# Patient Record
Sex: Female | Born: 1950
Health system: Southern US, Community
[De-identification: ages and names within clinical notes are randomized; demographics above are authoritative.]

## PROBLEM LIST (undated history)

## (undated) DIAGNOSIS — I10 Essential (primary) hypertension: Secondary | ICD-10-CM

## (undated) DIAGNOSIS — K635 Polyp of colon: Secondary | ICD-10-CM

## (undated) DIAGNOSIS — E785 Hyperlipidemia, unspecified: Secondary | ICD-10-CM

## (undated) DIAGNOSIS — K219 Gastro-esophageal reflux disease without esophagitis: Secondary | ICD-10-CM

## (undated) DIAGNOSIS — T8859XA Other complications of anesthesia, initial encounter: Secondary | ICD-10-CM

## (undated) DIAGNOSIS — Z8489 Family history of other specified conditions: Secondary | ICD-10-CM

## (undated) DIAGNOSIS — F419 Anxiety disorder, unspecified: Secondary | ICD-10-CM

## (undated) DIAGNOSIS — Z9889 Other specified postprocedural states: Secondary | ICD-10-CM

## (undated) DIAGNOSIS — R319 Hematuria, unspecified: Secondary | ICD-10-CM

## (undated) DIAGNOSIS — T4145XA Adverse effect of unspecified anesthetic, initial encounter: Secondary | ICD-10-CM

## (undated) DIAGNOSIS — C801 Malignant (primary) neoplasm, unspecified: Secondary | ICD-10-CM

## (undated) DIAGNOSIS — N189 Chronic kidney disease, unspecified: Secondary | ICD-10-CM

## (undated) DIAGNOSIS — R112 Nausea with vomiting, unspecified: Secondary | ICD-10-CM

## (undated) DIAGNOSIS — M810 Age-related osteoporosis without current pathological fracture: Secondary | ICD-10-CM

## (undated) HISTORY — DX: Hyperlipidemia, unspecified: E78.5

## (undated) HISTORY — DX: Age-related osteoporosis without current pathological fracture: M81.0

## (undated) HISTORY — DX: Polyp of colon: K63.5

## (undated) HISTORY — DX: Hematuria, unspecified: R31.9

## (undated) HISTORY — DX: Anxiety disorder, unspecified: F41.9

## (undated) HISTORY — PX: OTHER SURGICAL HISTORY: SHX169

## (undated) HISTORY — DX: Malignant (primary) neoplasm, unspecified: C80.1

## (undated) HISTORY — DX: Chronic kidney disease, unspecified: N18.9

---

## 2001-03-14 DIAGNOSIS — C801 Malignant (primary) neoplasm, unspecified: Secondary | ICD-10-CM

## 2001-03-14 HISTORY — DX: Malignant (primary) neoplasm, unspecified: C80.1

## 2001-03-29 ENCOUNTER — Other Ambulatory Visit: Admission: RE | Admit: 2001-03-29 | Discharge: 2001-03-29 | Payer: Self-pay | Admitting: Family Medicine

## 2001-10-01 HISTORY — PX: OTHER SURGICAL HISTORY: SHX169

## 2002-07-15 LAB — HM COLONOSCOPY: HM Colonoscopy: NORMAL

## 2004-01-29 ENCOUNTER — Ambulatory Visit: Payer: Self-pay | Admitting: Specialist

## 2004-04-30 ENCOUNTER — Ambulatory Visit: Payer: Self-pay | Admitting: Specialist

## 2004-05-12 ENCOUNTER — Ambulatory Visit: Payer: Self-pay

## 2005-03-21 ENCOUNTER — Ambulatory Visit: Payer: Self-pay | Admitting: Urology

## 2005-06-07 ENCOUNTER — Ambulatory Visit: Payer: Self-pay

## 2005-06-13 ENCOUNTER — Ambulatory Visit: Payer: Self-pay | Admitting: Urology

## 2005-12-14 ENCOUNTER — Ambulatory Visit: Payer: Self-pay | Admitting: Urology

## 2006-06-13 ENCOUNTER — Ambulatory Visit: Payer: Self-pay | Admitting: Family Medicine

## 2006-06-21 ENCOUNTER — Ambulatory Visit: Payer: Self-pay | Admitting: Urology

## 2007-08-13 ENCOUNTER — Ambulatory Visit: Payer: Self-pay | Admitting: Urology

## 2008-06-02 ENCOUNTER — Ambulatory Visit: Payer: Self-pay | Admitting: Family Medicine

## 2008-06-05 ENCOUNTER — Ambulatory Visit: Payer: Self-pay | Admitting: Family Medicine

## 2008-06-30 ENCOUNTER — Ambulatory Visit: Payer: Self-pay | Admitting: Urology

## 2008-12-08 ENCOUNTER — Ambulatory Visit: Payer: Self-pay | Admitting: Family Medicine

## 2009-02-19 ENCOUNTER — Emergency Department: Payer: Self-pay | Admitting: Emergency Medicine

## 2009-06-16 ENCOUNTER — Ambulatory Visit: Payer: Self-pay | Admitting: Family Medicine

## 2009-06-24 ENCOUNTER — Ambulatory Visit: Payer: Self-pay | Admitting: Urology

## 2010-06-10 LAB — HM PAP SMEAR: HM Pap smear: NORMAL

## 2010-06-16 ENCOUNTER — Ambulatory Visit: Payer: Self-pay | Admitting: Family Medicine

## 2010-06-30 ENCOUNTER — Ambulatory Visit: Payer: Self-pay | Admitting: Urology

## 2010-07-07 ENCOUNTER — Ambulatory Visit: Payer: Self-pay | Admitting: Family Medicine

## 2010-08-31 ENCOUNTER — Ambulatory Visit: Payer: Self-pay | Admitting: Family Medicine

## 2011-04-29 ENCOUNTER — Ambulatory Visit: Payer: Self-pay | Admitting: Unknown Physician Specialty

## 2011-12-19 ENCOUNTER — Other Ambulatory Visit: Payer: Self-pay | Admitting: Family Medicine

## 2012-03-26 ENCOUNTER — Encounter: Payer: Self-pay | Admitting: Family Medicine

## 2012-03-26 ENCOUNTER — Ambulatory Visit (INDEPENDENT_AMBULATORY_CARE_PROVIDER_SITE_OTHER): Payer: BC Managed Care – PPO | Admitting: Family Medicine

## 2012-03-26 VITALS — BP 134/84 | HR 61 | Temp 97.9°F | Resp 16 | Ht 64.0 in | Wt 150.8 lb

## 2012-03-26 DIAGNOSIS — Z1239 Encounter for other screening for malignant neoplasm of breast: Secondary | ICD-10-CM

## 2012-03-26 DIAGNOSIS — R7309 Other abnormal glucose: Secondary | ICD-10-CM

## 2012-03-26 DIAGNOSIS — E7800 Pure hypercholesterolemia, unspecified: Secondary | ICD-10-CM

## 2012-03-26 DIAGNOSIS — I10 Essential (primary) hypertension: Secondary | ICD-10-CM

## 2012-03-26 DIAGNOSIS — C649 Malignant neoplasm of unspecified kidney, except renal pelvis: Secondary | ICD-10-CM

## 2012-03-26 DIAGNOSIS — M8580 Other specified disorders of bone density and structure, unspecified site: Secondary | ICD-10-CM

## 2012-03-26 DIAGNOSIS — F329 Major depressive disorder, single episode, unspecified: Secondary | ICD-10-CM

## 2012-03-26 DIAGNOSIS — F419 Anxiety disorder, unspecified: Secondary | ICD-10-CM

## 2012-03-26 DIAGNOSIS — R05 Cough: Secondary | ICD-10-CM

## 2012-03-26 LAB — HEMOGLOBIN A1C
Hgb A1c MFr Bld: 5.9 % — ABNORMAL HIGH
Mean Plasma Glucose: 123 mg/dL — ABNORMAL HIGH

## 2012-03-26 LAB — CBC WITH DIFFERENTIAL/PLATELET
Basophils Relative: 1 % (ref 0–1)
Eosinophils Absolute: 0.1 10*3/uL (ref 0.0–0.7)
HCT: 40.1 % (ref 36.0–46.0)
Hemoglobin: 13.4 g/dL (ref 12.0–15.0)
MCH: 29.1 pg (ref 26.0–34.0)
MCHC: 33.4 g/dL (ref 30.0–36.0)
Monocytes Absolute: 0.4 10*3/uL (ref 0.1–1.0)
Monocytes Relative: 6 % (ref 3–12)

## 2012-03-26 LAB — LIPID PANEL
HDL: 64 mg/dL (ref >39)
LDL Cholesterol: 90 mg/dL (ref 0–99)
Triglycerides: 87 mg/dL (ref <150)
VLDL: 17 mg/dL (ref 0–40)

## 2012-03-26 LAB — COMPREHENSIVE METABOLIC PANEL
Alkaline Phosphatase: 40 U/L (ref 39–117)
BUN: 21 mg/dL (ref 6–23)
Glucose, Bld: 98 mg/dL (ref 70–99)
Total Bilirubin: 0.7 mg/dL (ref 0.3–1.2)

## 2012-03-26 LAB — CK: Total CK: 87 U/L (ref 7–177)

## 2012-03-26 NOTE — Progress Notes (Signed)
[address]   [place], [place]  [address]   [phone]  Subjective:    Patient ID: Margaret Massey, female    DOB: [birth year], 62 y.o.   MRN: [id number]  HPIThis 62 y.o. female presents to establish care and for nine month follow-up:  1.  Hyperlipidemia:  Nine month follow-up.  No changes to management made at last visit.  Reports compliance with medication, good tolerance to medication, good symptom control. Denies chest pain, palpitations, shortness of breath, leg swelling. Denies HA/dizziness/focal weakness/paresthesias.  2.  Depression: nine month follow-up.  Decreased Lexapro to 10mg  daily at last visit; coping well.  Family stressors recently; will be a challenging few months ahead.  Denies SI/HI. Reports good compliance with medication; good tolerance to medication; good symptom control. Work stressors stable.  Not exercising. Sleeping well.   3. Cough:  S/p [facility] consultation; +chronic PND; started on Prilosec 40mg  daily; s/p allergy testing negative.  Cough mildly better on Prilosec.  S/p CT scan sinuses.  S/p CXR negative.  Prescribed nasal spray initially but then stopped.  +nihgttime congestion.  Heat 67 degrees. Frequent throat clearing from PND.  Congested every morning.   4.  Renal Cell Carcinoma:  Appointment in 05/2012 with Dr. Achilles Dunk; followed annually at this time; doing well.    5.  Mammogram notification:  Due for mammogram; not sure when last CPE completed.  6.  Colon polyps:  Called about need for colonoscopy but did not complete stool cards provided; advised by [facility] that she was not due for colonoscopy.  Not due for colonoscopy.  [facility].     Review of Systems  Constitutional: Negative for fever, chills, diaphoresis, activity change, appetite change, fatigue and unexpected weight change.  HENT: Positive for congestion and postnasal drip. Negative for ear pain, sore throat, rhinorrhea, trouble swallowing, voice change and sinus pressure.     Respiratory: Positive for cough. Negative for shortness of breath and wheezing.   Cardiovascular: Negative for chest pain, palpitations and leg swelling.  Neurological: Negative for dizziness, tremors, syncope, facial asymmetry, speech difficulty, weakness, light-headedness, numbness and headaches.  Psychiatric/Behavioral: Negative for suicidal ideas, sleep disturbance, self-injury and dysphoric mood. The patient is nervous/anxious.         Past Medical History  Diagnosis Date  . Chronic kidney disease   . Anxiety   . Cancer     Renal Cell Carcinoma s/p nephrectomy  . Hyperlipidemia   . Osteoporosis     Past Surgical History  Procedure Date  . Right kidney removal     Prior to Admission medications   Medication Sig Start Date End Date Taking? Authorizing Provider  atorvastatin (LIPITOR) 10 MG tablet Take 10 mg by mouth daily.   Yes Historical Provider, MD  escitalopram (LEXAPRO) 10 MG tablet Take 10 mg by mouth daily.   Yes Historical Provider, MD  fish oil-omega-3 fatty acids 1000 MG capsule Take 2 g by mouth daily.   Yes Historical Provider, MD  Multiple Vitamin (MULTIVITAMIN) tablet Take 1 tablet by mouth daily.   Yes Historical Provider, MD  omeprazole (PRILOSEC) 40 MG capsule Take 40 mg by mouth daily.   Yes Historical Provider, MD  OVER THE COUNTER MEDICATION Calcium D3 750 mg taking daily   Yes Historical Provider, MD    No Known Allergies  History   Social History  . Marital Status: Single    Spouse Name: N/A    Number of Children: N/A  . Years of Education: college  Occupational History  . operation Production designer, theatre/television/film    Social History Main Topics  . Smoking status: Never Smoker   . Smokeless tobacco: Never Used  . Alcohol Use: 0.6 oz/week    1 Glasses of wine per week     Comment: 1 glass of wine per month on average.  . Drug Use: No  . Sexually Active: Yes    Birth Control/ Protection: Post-menopausal   Other Topics Concern  . Not on file   Social History  Narrative   Marital status: married   Children:   Lives: with husband   Employment:  Nature conservation officer   Tobacco: never   Alcohol: one glass of wine per month   Drugs: none  Exercise:  none    Family History  Problem Relation Age of Onset  . Heart disease Father   . Heart disease Sister     pacemaker    Objective:   Physical Exam  Nursing note and vitals reviewed. Constitutional: She is oriented to person, place, and time. She appears well-developed and well-nourished. No distress.  HENT:  Head: Normocephalic and atraumatic.  Right Ear: External ear normal.  Left Ear: External ear normal.  Nose: Nose normal.  Mouth/Throat: Oropharynx is clear and moist. No oropharyngeal exudate.  Eyes: Conjunctivae normal and EOM are normal. Pupils are equal, round, and reactive to light.  Neck: Normal range of motion. Neck supple. No thyromegaly present.  Cardiovascular: Normal rate, regular rhythm and normal heart sounds.  Exam reveals no gallop and no friction rub.   No murmur heard. Pulmonary/Chest: Effort normal and breath sounds normal. She has no wheezes. She has no rales.  Lymphadenopathy:    She has no cervical adenopathy.  Neurological: She is alert and oriented to person, place, and time. No cranial nerve deficit. She exhibits normal muscle tone. Coordination normal.  Skin: Skin is warm and dry. No rash noted. She is not diaphoretic.  Psychiatric: She has a normal mood and affect. Her behavior is normal. Judgment and thought content normal.       Assessment & Plan:   1. Essential hypertension, benign  CBC with Differential, Comprehensive metabolic panel, CK  2. Pure hypercholesterolemia  CBC with Differential, Lipid panel, CK  3. Other abnormal glucose  Hemoglobin A1c  4. Breast cancer screening  MM Digital Screening  5. Anxiety and depression

## 2012-03-26 NOTE — Patient Instructions (Addendum)
1. Essential hypertension, benign  CBC with Differential, Comprehensive metabolic panel, CK  2. Pure hypercholesterolemia  CBC with Differential, Lipid panel, CK  3. Other abnormal glucose  Hemoglobin A1c

## 2012-03-27 ENCOUNTER — Encounter: Payer: Self-pay | Admitting: Family Medicine

## 2012-03-27 DIAGNOSIS — R05 Cough: Secondary | ICD-10-CM | POA: Insufficient documentation

## 2012-03-27 DIAGNOSIS — R7309 Other abnormal glucose: Secondary | ICD-10-CM | POA: Insufficient documentation

## 2012-03-27 DIAGNOSIS — F419 Anxiety disorder, unspecified: Secondary | ICD-10-CM | POA: Insufficient documentation

## 2012-03-27 DIAGNOSIS — E7800 Pure hypercholesterolemia, unspecified: Secondary | ICD-10-CM | POA: Insufficient documentation

## 2012-03-27 DIAGNOSIS — M8580 Other specified disorders of bone density and structure, unspecified site: Secondary | ICD-10-CM | POA: Insufficient documentation

## 2012-03-27 DIAGNOSIS — C649 Malignant neoplasm of unspecified kidney, except renal pelvis: Secondary | ICD-10-CM | POA: Insufficient documentation

## 2012-03-27 DIAGNOSIS — Z1239 Encounter for other screening for malignant neoplasm of breast: Secondary | ICD-10-CM | POA: Insufficient documentation

## 2012-03-27 NOTE — Assessment & Plan Note (Signed)
New. Repeat today; recommend dietary modification, weight loss, exercise.

## 2012-03-27 NOTE — Assessment & Plan Note (Signed)
Stable; refill of Lexapro; family stressors recently; to call if warrants increase in Lexapro dose before next appointment.

## 2012-03-27 NOTE — Assessment & Plan Note (Signed)
Controlled; no change in management; obtain labs. 

## 2012-03-27 NOTE — Assessment & Plan Note (Signed)
Persistent but improved with Prilosec and temporary nasal spray use; obtain records from [facility].

## 2012-03-27 NOTE — Assessment & Plan Note (Signed)
Refer for mammogram; schedule CPE for next visit.

## 2012-04-24 ENCOUNTER — Encounter: Payer: Self-pay | Admitting: *Deleted

## 2012-05-21 ENCOUNTER — Encounter: Payer: Self-pay | Admitting: Family Medicine

## 2012-06-12 ENCOUNTER — Other Ambulatory Visit: Payer: Self-pay | Admitting: Family Medicine

## 2012-07-11 ENCOUNTER — Ambulatory Visit: Payer: Self-pay | Admitting: Urology

## 2012-07-11 DIAGNOSIS — R3129 Other microscopic hematuria: Secondary | ICD-10-CM | POA: Insufficient documentation

## 2012-07-11 DIAGNOSIS — N3020 Other chronic cystitis without hematuria: Secondary | ICD-10-CM | POA: Insufficient documentation

## 2012-07-11 DIAGNOSIS — N393 Stress incontinence (female) (male): Secondary | ICD-10-CM | POA: Insufficient documentation

## 2012-07-11 DIAGNOSIS — Z87448 Personal history of other diseases of urinary system: Secondary | ICD-10-CM | POA: Insufficient documentation

## 2012-07-11 DIAGNOSIS — R339 Retention of urine, unspecified: Secondary | ICD-10-CM | POA: Insufficient documentation

## 2012-08-20 ENCOUNTER — Other Ambulatory Visit: Payer: Self-pay

## 2012-08-20 MED ORDER — ESCITALOPRAM OXALATE 10 MG PO TABS: 10.0000 mg | ORAL_TABLET | Freq: Every day | ORAL | Status: DC

## 2012-09-24 ENCOUNTER — Encounter: Payer: BC Managed Care – PPO | Admitting: Family Medicine

## 2012-10-16 ENCOUNTER — Other Ambulatory Visit: Payer: Self-pay | Admitting: Family Medicine

## 2012-11-14 ENCOUNTER — Other Ambulatory Visit: Payer: Self-pay

## 2012-11-14 MED ORDER — ATORVASTATIN CALCIUM 10 MG PO TABS: 10.0000 mg | ORAL_TABLET | Freq: Every day | ORAL | Status: DC

## 2012-11-14 MED ORDER — ESCITALOPRAM OXALATE 10 MG PO TABS: 10.0000 mg | ORAL_TABLET | Freq: Every day | ORAL | Status: DC

## 2012-11-19 ENCOUNTER — Other Ambulatory Visit: Payer: Self-pay | Admitting: Family Medicine

## 2012-12-13 ENCOUNTER — Other Ambulatory Visit: Payer: Self-pay | Admitting: Family Medicine

## 2012-12-13 NOTE — Telephone Encounter (Signed)
Needs OV, 3rd notice, no further refills

## 2013-01-08 ENCOUNTER — Other Ambulatory Visit: Payer: Self-pay

## 2013-01-08 MED ORDER — ESCITALOPRAM OXALATE 10 MG PO TABS: 10.0000 mg | ORAL_TABLET | Freq: Every day | ORAL | Status: DC

## 2013-01-08 MED ORDER — ATORVASTATIN CALCIUM 10 MG PO TABS: 10.0000 mg | ORAL_TABLET | Freq: Every day | ORAL | Status: DC

## 2013-01-08 NOTE — Telephone Encounter (Signed)
Call ---- refills provided and sent to pharmacy.

## 2013-01-08 NOTE — Addendum Note (Signed)
Addended by: Ethelda Chick on: 01/08/2013 10:48 AM   Modules accepted: Orders

## 2013-01-08 NOTE — Telephone Encounter (Signed)
Please advise, pended.  

## 2013-01-08 NOTE — Addendum Note (Signed)
Addended byCaffie Damme on: 01/08/2013 10:21 AM   Modules accepted: Orders

## 2013-01-08 NOTE — Telephone Encounter (Signed)
Pt states that her refill request was denied for Lexapro and Lipitor. Pt has scheduled an appt to see Dr. Katrinka Blazing on 02/27/13. Pt  would like to know if she can now have her refill request approved.  Please call.

## 2013-01-09 NOTE — Telephone Encounter (Signed)
Called to make sure pt aware of RFs

## 2013-01-31 ENCOUNTER — Other Ambulatory Visit: Payer: Self-pay | Admitting: Family Medicine

## 2013-02-27 ENCOUNTER — Ambulatory Visit (INDEPENDENT_AMBULATORY_CARE_PROVIDER_SITE_OTHER): Payer: No Typology Code available for payment source | Admitting: Family Medicine

## 2013-02-27 ENCOUNTER — Encounter: Payer: Self-pay | Admitting: Family Medicine

## 2013-02-27 VITALS — BP 153/85 | HR 68 | Temp 98.3°F | Resp 16 | Ht 63.5 in | Wt 154.0 lb

## 2013-02-27 DIAGNOSIS — F329 Major depressive disorder, single episode, unspecified: Secondary | ICD-10-CM

## 2013-02-27 DIAGNOSIS — Z01419 Encounter for gynecological examination (general) (routine) without abnormal findings: Secondary | ICD-10-CM

## 2013-02-27 DIAGNOSIS — M810 Age-related osteoporosis without current pathological fracture: Secondary | ICD-10-CM

## 2013-02-27 DIAGNOSIS — M8580 Other specified disorders of bone density and structure, unspecified site: Secondary | ICD-10-CM

## 2013-02-27 DIAGNOSIS — C649 Malignant neoplasm of unspecified kidney, except renal pelvis: Secondary | ICD-10-CM

## 2013-02-27 DIAGNOSIS — Z0000 Encounter for general adult medical examination without abnormal findings: Secondary | ICD-10-CM

## 2013-02-27 DIAGNOSIS — E7800 Pure hypercholesterolemia, unspecified: Secondary | ICD-10-CM

## 2013-02-27 LAB — POCT UA - MICROSCOPIC ONLY
Casts, Ur, LPF, POC: NEGATIVE
Crystals, Ur, HPF, POC: NEGATIVE
Mucus, UA: NEGATIVE

## 2013-02-27 LAB — COMPREHENSIVE METABOLIC PANEL
ALT: 18 U/L (ref 0–35)
AST: 18 U/L (ref 0–37)
Alkaline Phosphatase: 43 U/L (ref 39–117)
Calcium: 9.3 mg/dL (ref 8.4–10.5)
Chloride: 107 mEq/L (ref 96–112)
Glucose, Bld: 102 mg/dL — ABNORMAL HIGH (ref 70–99)
Sodium: 141 mEq/L (ref 135–145)
Total Bilirubin: 0.7 mg/dL (ref 0.3–1.2)
Total Protein: 6.2 g/dL (ref 6.0–8.3)

## 2013-02-27 LAB — POCT URINALYSIS DIPSTICK
Protein, UA: NEGATIVE
Spec Grav, UA: 1.02
Urobilinogen, UA: 0.2

## 2013-02-27 LAB — LIPID PANEL
HDL: 64 mg/dL (ref >39)
LDL Cholesterol: 81 mg/dL (ref 0–99)
Triglycerides: 96 mg/dL (ref <150)
VLDL: 19 mg/dL (ref 0–40)

## 2013-02-27 LAB — CBC
Hemoglobin: 13.4 g/dL (ref 12.0–15.0)
MCH: 29.2 pg (ref 26.0–34.0)
MCHC: 34 g/dL (ref 30.0–36.0)
Platelets: 259 10*3/uL (ref 150–400)
RBC: 4.59 MIL/uL (ref 3.87–5.11)
RDW: 13.6 % (ref 11.5–15.5)

## 2013-02-27 MED ORDER — OMEPRAZOLE 40 MG PO CPDR: 40.0000 mg | DELAYED_RELEASE_CAPSULE | Freq: Every day | ORAL | Status: DC

## 2013-02-27 MED ORDER — ESCITALOPRAM OXALATE 10 MG PO TABS: 10.0000 mg | ORAL_TABLET | Freq: Every day | ORAL | Status: DC

## 2013-02-27 MED ORDER — ATORVASTATIN CALCIUM 10 MG PO TABS: 10.0000 mg | ORAL_TABLET | Freq: Every day | ORAL | Status: DC

## 2013-02-27 NOTE — Patient Instructions (Signed)
1.  Call [facility] to schedule mammogram at [facility] [phone].

## 2013-02-27 NOTE — Progress Notes (Signed)
Subjective:    Patient ID: Margaret Massey, female    DOB: 09-Sep-1950, 62 y.o.   MRN: [id number]  Gynecologic Exam The patient's pertinent negatives include no pelvic pain or vaginal discharge. Pertinent negatives include no abdominal pain, back pain, chills, constipation, diarrhea, dysuria, fever, flank pain, frequency, headaches, hematuria, nausea, rash, sore throat, urgency or vomiting.   This 62 y.o. female presents for Complete Physical Examination.  Last physical 2013. Pap smear 2013. Mammogram 2013. [facility]. Colonoscopy overdue; has paperwork to schedule.  [facility]. Bone density scan 2013. Tetanus 2013. Pneumovax never. Zostavax never; requesting. Flu vaccine 11/2012 [facility]. Eye exam every year.  Contacts.  [facility]. Dental exam every six months.  [facility].    [facility].  Renal Cell Carcinoma:  Goes annually in March 2014 to see [facility].   Review of Systems  Constitutional: Negative for fever, chills, diaphoresis, activity change, appetite change, fatigue and unexpected weight change.  HENT: Negative for congestion, dental problem, drooling, ear discharge, ear pain, facial swelling, hearing loss, mouth sores, nosebleeds, postnasal drip, rhinorrhea, sinus pressure, sneezing, sore throat, tinnitus, trouble swallowing and voice change.   Eyes: Negative for photophobia, pain, discharge, redness, itching and visual disturbance.  Respiratory: Negative for apnea, cough, choking, chest tightness, shortness of breath, wheezing and stridor.   Cardiovascular: Negative for chest pain, palpitations and leg swelling.  Gastrointestinal: Negative for nausea, vomiting, abdominal pain, diarrhea, constipation, blood in stool, abdominal distention, anal bleeding and rectal pain.  Endocrine: Negative for cold intolerance, heat intolerance, polydipsia, polyphagia and polyuria.  Genitourinary: Negative for dysuria, urgency, frequency, hematuria, flank pain, decreased urine volume, vaginal  bleeding, vaginal discharge, enuresis, difficulty urinating, genital sores, vaginal pain, menstrual problem, pelvic pain and dyspareunia.  Musculoskeletal: Negative for arthralgias, back pain, gait problem, joint swelling, myalgias, neck pain and neck stiffness.  Skin: Negative for color change, pallor, rash and wound.  Allergic/Immunologic: Negative for environmental allergies, food allergies and immunocompromised state.  Neurological: Negative for dizziness, tremors, seizures, syncope, facial asymmetry, speech difficulty, weakness, light-headedness, numbness and headaches.  Hematological: Negative for adenopathy. Does not bruise/bleed easily.  Psychiatric/Behavioral: Negative for suicidal ideas, hallucinations, behavioral problems, confusion, sleep disturbance, self-injury, dysphoric mood, decreased concentration and agitation. The patient is not nervous/anxious and is not hyperactive.        Objective:   Physical Exam  Nursing note and vitals reviewed. Constitutional: She is oriented to person, place, and time. She appears well-developed and well-nourished. No distress.  HENT:  Head: Normocephalic and atraumatic.  Right Ear: External ear normal.  Left Ear: External ear normal.  Nose: Nose normal.  Mouth/Throat: Oropharynx is clear and moist.  Eyes: Conjunctivae and EOM are normal. Pupils are equal, round, and reactive to light.  Neck: Normal range of motion and full passive range of motion without pain. Neck supple. No JVD present. Carotid bruit is not present. No thyromegaly present.  Cardiovascular: Normal rate, regular rhythm and normal heart sounds.  Exam reveals no gallop and no friction rub.   No murmur heard. Pulmonary/Chest: Effort normal and breath sounds normal. She has no wheezes. She has no rales. Right breast exhibits no inverted nipple, no mass, no nipple discharge, no skin change and no tenderness. Left breast exhibits no inverted nipple, no mass, no nipple discharge, no  skin change and no tenderness. Breasts are symmetrical.  Abdominal: Soft. Bowel sounds are normal. She exhibits no distension and no mass. There is no tenderness. There is no rebound and no guarding.  Genitourinary: Vagina normal  and uterus normal. There is no rash, tenderness or lesion on the right labia. There is no rash, tenderness or lesion on the left labia. Cervix exhibits no motion tenderness, no discharge and no friability. Right adnexum displays no mass, no tenderness and no fullness. Left adnexum displays no mass, no tenderness and no fullness.  Musculoskeletal:       Right shoulder: Normal.       Left shoulder: Normal.       Cervical back: Normal.  Lymphadenopathy:    She has no cervical adenopathy.  Neurological: She is alert and oriented to person, place, and time. She has normal reflexes. No cranial nerve deficit. She exhibits normal muscle tone. Coordination normal.  Skin: Skin is warm and dry. No rash noted. She is not diaphoretic. No erythema. No pallor.  Psychiatric: She has a normal mood and affect. Her behavior is normal. Judgment and thought content normal.         Assessment & Plan:  Routine general medical examination at a health care facility - Plan: CBC, Comprehensive metabolic panel, Lipid panel, TSH, POCT urinalysis dipstick, EKG 12-Lead, Hemoglobin A1c, POCT UA - Microscopic Only, IFOBT POC (occult bld, rslt in office), Pap IG and HPV (high risk) DNA detection  Routine gynecological examination - Plan: MM Digital Screening  Osteoporosis, unspecified - Plan: DG Bone Density  Renal cell carcinoma, unspecified laterality  Pure hypercholesterolemia  Osteopenia  Anxiety and depression  1.  Complete physical examination:  Anticipatory guidance --- weight loss, exercise.  Pap smear obtained; refer for mammogram and bone density scan.  Pt to schedule repeat colonoscopy; hemosure obtained.  Recommend checking with insurance regarding Zostavax coverage.   2.  Gynecological exam: pap smear obtained; refer for mammogram. 3. Osteoporosis: stable; repeat bone density scan.  Continue calcium plus D; recommend weight bearing exercises.   4.  Depression with anxiety: stable; refill of Lexapro provided.  Follow-up in six months. 5.  Hyperlipidemia: controlled; refill of Lipitor provided.  6. GERD: controlled with Prilosec 40mg  daily. 7. RCC: stable; s/p nephrectomy; followed by Dr. Achilles Dunk yearly.  Meds ordered this encounter  Medications  . atorvastatin (LIPITOR) 10 MG tablet    Sig: Take 1 tablet (10 mg total) by mouth daily.    Dispense:  30 tablet    Refill:  11  . escitalopram (LEXAPRO) 10 MG tablet    Sig: Take 1 tablet (10 mg total) by mouth daily.    Dispense:  30 tablet    Refill:  11  . omeprazole (PRILOSEC) 40 MG capsule    Sig: Take 1 capsule (40 mg total) by mouth daily.    Dispense:  30 capsule    Refill:  11   Nilda Simmer, M.D.  [facility] [address] [place], [place]  [address] [phone] phone [phone] fax

## 2013-02-28 LAB — TSH: TSH: 2.417 u[IU]/mL (ref 0.350–4.500)

## 2013-03-01 LAB — PAP IG AND HPV HIGH-RISK: HPV DNA High Risk: NOT DETECTED

## 2013-03-15 ENCOUNTER — Encounter: Payer: Self-pay | Admitting: Radiology

## 2013-08-06 DIAGNOSIS — Z9889 Other specified postprocedural states: Secondary | ICD-10-CM | POA: Diagnosis not present

## 2013-08-06 DIAGNOSIS — Z09 Encounter for follow-up examination after completed treatment for conditions other than malignant neoplasm: Secondary | ICD-10-CM | POA: Diagnosis not present

## 2013-08-06 DIAGNOSIS — N1330 Unspecified hydronephrosis: Secondary | ICD-10-CM | POA: Diagnosis not present

## 2013-08-06 DIAGNOSIS — Z85528 Personal history of other malignant neoplasm of kidney: Secondary | ICD-10-CM | POA: Diagnosis not present

## 2013-08-14 DIAGNOSIS — N393 Stress incontinence (female) (male): Secondary | ICD-10-CM | POA: Diagnosis not present

## 2013-08-14 DIAGNOSIS — M439 Deforming dorsopathy, unspecified: Secondary | ICD-10-CM | POA: Insufficient documentation

## 2013-08-14 DIAGNOSIS — Z85528 Personal history of other malignant neoplasm of kidney: Secondary | ICD-10-CM | POA: Diagnosis not present

## 2013-08-14 DIAGNOSIS — R3129 Other microscopic hematuria: Secondary | ICD-10-CM | POA: Diagnosis not present

## 2013-08-14 DIAGNOSIS — N281 Cyst of kidney, acquired: Secondary | ICD-10-CM | POA: Diagnosis not present

## 2013-10-10 ENCOUNTER — Other Ambulatory Visit: Payer: Self-pay | Admitting: Family Medicine

## 2013-10-10 DIAGNOSIS — M810 Age-related osteoporosis without current pathological fracture: Secondary | ICD-10-CM

## 2013-10-10 DIAGNOSIS — Z1239 Encounter for other screening for malignant neoplasm of breast: Secondary | ICD-10-CM

## 2013-11-13 ENCOUNTER — Ambulatory Visit: Payer: Self-pay | Admitting: Family Medicine

## 2013-11-13 ENCOUNTER — Telehealth: Payer: Self-pay

## 2013-11-13 DIAGNOSIS — M949 Disorder of cartilage, unspecified: Secondary | ICD-10-CM | POA: Diagnosis not present

## 2013-11-13 DIAGNOSIS — M899 Disorder of bone, unspecified: Secondary | ICD-10-CM | POA: Diagnosis not present

## 2013-11-13 DIAGNOSIS — Z1231 Encounter for screening mammogram for malignant neoplasm of breast: Secondary | ICD-10-CM | POA: Diagnosis not present

## 2013-11-13 LAB — HM MAMMOGRAPHY

## 2013-11-13 NOTE — Telephone Encounter (Signed)
Order reprinted Georgiann Mccoy signed and faxed to requested fax number.

## 2013-11-13 NOTE — Telephone Encounter (Signed)
Katina @ [facility] requesting an order for patient for a Bond Density to be faxed to [phone]. Per Alwyn Ren patient was there today for her MMg and they went ahead and done her Bone Density. Call back number for [facility] is [phone]

## 2013-12-10 ENCOUNTER — Encounter: Payer: Self-pay | Admitting: Family Medicine

## 2013-12-25 DIAGNOSIS — Z23 Encounter for immunization: Secondary | ICD-10-CM | POA: Diagnosis not present

## 2014-01-11 DIAGNOSIS — J019 Acute sinusitis, unspecified: Secondary | ICD-10-CM | POA: Diagnosis not present

## 2014-01-15 ENCOUNTER — Ambulatory Visit: Payer: No Typology Code available for payment source | Admitting: Family Medicine

## 2014-01-22 ENCOUNTER — Ambulatory Visit (INDEPENDENT_AMBULATORY_CARE_PROVIDER_SITE_OTHER): Payer: Medicare Other | Admitting: Family Medicine

## 2014-01-22 ENCOUNTER — Encounter: Payer: Self-pay | Admitting: Family Medicine

## 2014-01-22 VITALS — BP 148/22 | HR 68 | Temp 98.4°F | Resp 16 | Ht 63.5 in | Wt 153.0 lb

## 2014-01-22 DIAGNOSIS — E785 Hyperlipidemia, unspecified: Secondary | ICD-10-CM

## 2014-01-22 DIAGNOSIS — R7302 Impaired glucose tolerance (oral): Secondary | ICD-10-CM

## 2014-01-22 DIAGNOSIS — Z23 Encounter for immunization: Secondary | ICD-10-CM

## 2014-01-22 DIAGNOSIS — R059 Cough, unspecified: Secondary | ICD-10-CM

## 2014-01-22 DIAGNOSIS — E7800 Pure hypercholesterolemia, unspecified: Secondary | ICD-10-CM

## 2014-01-22 DIAGNOSIS — F419 Anxiety disorder, unspecified: Secondary | ICD-10-CM

## 2014-01-22 DIAGNOSIS — F418 Other specified anxiety disorders: Secondary | ICD-10-CM

## 2014-01-22 DIAGNOSIS — F329 Major depressive disorder, single episode, unspecified: Secondary | ICD-10-CM

## 2014-01-22 DIAGNOSIS — M8580 Other specified disorders of bone density and structure, unspecified site: Secondary | ICD-10-CM

## 2014-01-22 DIAGNOSIS — R05 Cough: Secondary | ICD-10-CM | POA: Diagnosis not present

## 2014-01-22 LAB — CBC WITH DIFFERENTIAL/PLATELET
BASOS PCT: 1 % (ref 0–1)
Basophils Absolute: 0.1 10*3/uL (ref 0.0–0.1)
Eosinophils Absolute: 0.1 10*3/uL (ref 0.0–0.7)
Eosinophils Relative: 1 % (ref 0–5)
HEMATOCRIT: 38.3 % (ref 36.0–46.0)
HEMOGLOBIN: 12.6 g/dL (ref 12.0–15.0)
Lymphocytes Relative: 24 % (ref 12–46)
Lymphs Abs: 1.8 10*3/uL (ref 0.7–4.0)
MCH: 28.6 pg (ref 26.0–34.0)
MCHC: 32.9 g/dL (ref 30.0–36.0)
MCV: 87 fL (ref 78.0–100.0)
MONOS PCT: 7 % (ref 3–12)
Monocytes Absolute: 0.5 10*3/uL (ref 0.1–1.0)
NEUTROS ABS: 5.2 10*3/uL (ref 1.7–7.7)
Neutrophils Relative %: 67 % (ref 43–77)
Platelets: 304 10*3/uL (ref 150–400)
RBC: 4.4 MIL/uL (ref 3.87–5.11)
RDW: 13.4 % (ref 11.5–15.5)
WBC: 7.7 10*3/uL (ref 4.0–10.5)

## 2014-01-22 LAB — COMPLETE METABOLIC PANEL WITH GFR
ALBUMIN: 3.9 g/dL (ref 3.5–5.2)
ALK PHOS: 40 U/L (ref 39–117)
ALT: 15 U/L (ref 0–35)
AST: 16 U/L (ref 0–37)
BUN: 22 mg/dL (ref 6–23)
CHLORIDE: 107 meq/L (ref 96–112)
CO2: 24 mEq/L (ref 19–32)
Calcium: 9.5 mg/dL (ref 8.4–10.5)
Creat: 1.11 mg/dL — ABNORMAL HIGH (ref 0.50–1.10)
GFR, EST NON AFRICAN AMERICAN: 52 mL/min — AB
GFR, Est African American: 60 mL/min
GLUCOSE: 88 mg/dL (ref 70–99)
POTASSIUM: 4.4 meq/L (ref 3.5–5.3)
SODIUM: 142 meq/L (ref 135–145)
TOTAL PROTEIN: 6.2 g/dL (ref 6.0–8.3)
Total Bilirubin: 0.5 mg/dL (ref 0.2–1.2)

## 2014-01-22 LAB — LIPID PANEL
CHOLESTEROL: 146 mg/dL (ref 0–200)
HDL: 57 mg/dL (ref >39)
LDL CALC: 72 mg/dL (ref 0–99)
TRIGLYCERIDES: 86 mg/dL (ref <150)
Total CHOL/HDL Ratio: 2.6 Ratio
VLDL: 17 mg/dL (ref 0–40)

## 2014-01-22 NOTE — Progress Notes (Deleted)
   Subjective:    Patient ID: Margaret Massey, female    DOB: [birth year], 63 y.o.   MRN: [id number]  HPI    Review of Systems     Objective:   Physical Exam        Assessment & Plan:

## 2014-01-23 LAB — HEMOGLOBIN A1C
HEMOGLOBIN A1C: 5.9 % — AB (ref <5.7)
Mean Plasma Glucose: 123 mg/dL — ABNORMAL HIGH (ref <117)

## 2014-01-26 MED ORDER — ESCITALOPRAM OXALATE 10 MG PO TABS: 10.0000 mg | ORAL_TABLET | Freq: Every day | ORAL | Status: DC

## 2014-01-26 MED ORDER — ATORVASTATIN CALCIUM 10 MG PO TABS: 10.0000 mg | ORAL_TABLET | Freq: Every day | ORAL | Status: DC

## 2014-01-26 MED ORDER — OMEPRAZOLE 40 MG PO CPDR: 40.0000 mg | DELAYED_RELEASE_CAPSULE | Freq: Every day | ORAL | Status: DC

## 2014-01-26 NOTE — Progress Notes (Signed)
Subjective:    Patient ID: Margaret Massey, female    DOB: 1950-10-28, 63 y.o.   MRN: [id number]  01/22/2014  hypercholesterolemia and glucose intolerance   HPI This 63 y.o. female presents for six month follow-up of the following:  1. Hyperlipidemia: no changes to management made at last visit.  Patient reports good compliance with medication, good tolerance to medication, and good symptom control.  Pt denies CP/palp/SOB/leg swelling.  Denies HA/dizziness/focal weakness/paresthesias. Not exercising.  2.  Glucose intolerance: not exercising regularly.    3.  Elevated blood pressures: continues to check BP at home; running in 130s/80s.  Has White Coat Syndrome; gets very nervous to come to doctor's office.  4.  Anxiety and depression: son had gone through a really nasty divorce.  Ex daughter in law is being really difficult. Pt struggling to handle issues related to divorce. Pt interested in increasing Lexapro to one whole tablet. Also interested in counseling.  Denies SI/HI.  5.  Cough: has persisted since last visit.  S/p ENT evaluation in past.  Started on Prilosec without improvement in cough.  No previous pulmonology evaluation; undergoes CXR with urologist yearly and normal.  No associated SOB.  Feels probably secondary to allergies.  6.  Osteopenia: s/p bone density scan after last visit.  Compliance with MVI, Vitamin D supplementation.  Not exercising.  7. Renal cell carcinoma: stable; followed by Dr. Jacqlyn Larsen annually now; last visit in 05/2013.    Review of Systems  Constitutional: Negative for fever, chills, diaphoresis and fatigue.  HENT: Positive for congestion. Negative for ear pain, postnasal drip, rhinorrhea, sore throat, trouble swallowing and voice change.   Eyes: Negative for visual disturbance.  Respiratory: Positive for cough. Negative for shortness of breath, wheezing and stridor.   Cardiovascular: Negative for chest pain, palpitations and leg swelling.    Gastrointestinal: Negative for nausea, vomiting, abdominal pain, diarrhea and constipation.  Endocrine: Negative for cold intolerance, heat intolerance, polydipsia, polyphagia and polyuria.  Genitourinary: Negative for hematuria.  Neurological: Negative for dizziness, tremors, seizures, syncope, facial asymmetry, speech difficulty, weakness, light-headedness, numbness and headaches.  Psychiatric/Behavioral: Negative for suicidal ideas, sleep disturbance, self-injury and dysphoric mood. The patient is nervous/anxious.     Past Medical History  Diagnosis Date  . Chronic kidney disease   . Anxiety   . Cancer     Renal Cell Carcinoma s/p nephrectomy  . Hyperlipidemia   . Osteoporosis   . Hematuria, unspecified    Past Surgical History  Procedure Laterality Date  . Right kidney removal      Renal Cell Carcinoma.  . Jaw surgery     Allergies  Allergen Reactions  . Morphine And Related Nausea And Vomiting   Current Outpatient Prescriptions  Medication Sig Dispense Refill  . atorvastatin (LIPITOR) 10 MG tablet Take 1 tablet (10 mg total) by mouth daily. 30 tablet 11  . Cholecalciferol (VITAMIN D-3) 1000 UNITS CAPS Take 1 capsule by mouth daily.    Marland Kitchen escitalopram (LEXAPRO) 10 MG tablet Take 1 tablet (10 mg total) by mouth daily. 30 tablet 11  . fish oil-omega-3 fatty acids 1000 MG capsule Take 2 g by mouth daily.    . Multiple Vitamin (MULTIVITAMIN) tablet Take 1 tablet by mouth daily.    Marland Kitchen omeprazole (PRILOSEC) 40 MG capsule Take 1 capsule (40 mg total) by mouth daily. 30 capsule 11  . OVER THE COUNTER MEDICATION Calcium D3 750 mg taking daily     No current facility-administered medications for this visit.  Objective:    BP 148/22 mmHg  Pulse 68  Temp(Src) 98.4 F (36.9 C) (Oral)  Resp 16  Ht 5' 3.5" (1.613 m)  Wt 153 lb (69.4 kg)  BMI 26.67 kg/m2  SpO2 96% Physical Exam  Constitutional: She is oriented to person, place, and time. She appears well-developed and  well-nourished. No distress.  HENT:  Head: Normocephalic and atraumatic.  Right Ear: External ear normal.  Left Ear: External ear normal.  Nose: Nose normal.  Mouth/Throat: Oropharynx is clear and moist.  Eyes: Conjunctivae and EOM are normal. Pupils are equal, round, and reactive to light.  Neck: Normal range of motion. Neck supple. Carotid bruit is not present. No thyromegaly present.  Cardiovascular: Normal rate, regular rhythm, normal heart sounds and intact distal pulses.  Exam reveals no gallop and no friction rub.   No murmur heard. Pulmonary/Chest: Effort normal and breath sounds normal. She has no wheezes. She has no rales.  Abdominal: Soft. Bowel sounds are normal. She exhibits no distension and no mass. There is no tenderness. There is no rebound and no guarding.  Lymphadenopathy:    She has no cervical adenopathy.  Neurological: She is alert and oriented to person, place, and time. No cranial nerve deficit.  Skin: Skin is warm and dry. No rash noted. She is not diaphoretic. No erythema. No pallor.  Psychiatric: She has a normal mood and affect. Her behavior is normal.    PREVNAR-13 ADMINISTERED.    Assessment & Plan:   1. Need for prophylactic vaccination against Streptococcus pneumoniae (pneumococcus)   2. Glucose intolerance (impaired glucose tolerance)   3. Pure hypercholesterolemia   4. Osteopenia   5. Anxiety and depression   6. Cough      1. Hyperlipidemia: controlled; obtain labs; refill provided. 2.  Glucose intolerance: stable; obtain labs; recommend weight loss, exercise, dietary modification. 3.  Osteopenia: stable; continue with Calcium and Vitamin D supplementation; recommend daily weight bearing exercise.  Repeat bone density in two years. 4.  Anxiety and depression: worsening; increase Lexapro to one whole tablet daily.  Recommend psychotherapy. 5.  Cough: persistent; pt declined referral to pulmonologist; s/p ENT consultation in the past; no  improvement with Prilosec daily.  Pt desires to monitor for next six months.   6.  S/p Prevnar 13 in office.    No orders of the defined types were placed in this encounter.    Return in about 6 months (around 07/23/2014) for complete physical examiniation.    Reginia Forts, M.D.  [facility] [address] [place], [place]  [address] [phone] phone [phone] fax

## 2014-03-05 ENCOUNTER — Encounter: Payer: No Typology Code available for payment source | Admitting: Family Medicine

## 2014-07-14 ENCOUNTER — Ambulatory Visit (INDEPENDENT_AMBULATORY_CARE_PROVIDER_SITE_OTHER): Payer: Medicare Other | Admitting: Family Medicine

## 2014-07-14 ENCOUNTER — Encounter: Payer: Self-pay | Admitting: Family Medicine

## 2014-07-14 VITALS — BP 130/90 | HR 81 | Temp 97.6°F | Resp 16 | Ht 63.5 in | Wt 155.6 lb

## 2014-07-14 DIAGNOSIS — I10 Essential (primary) hypertension: Secondary | ICD-10-CM | POA: Diagnosis not present

## 2014-07-14 DIAGNOSIS — C649 Malignant neoplasm of unspecified kidney, except renal pelvis: Secondary | ICD-10-CM

## 2014-07-14 DIAGNOSIS — N393 Stress incontinence (female) (male): Secondary | ICD-10-CM | POA: Diagnosis not present

## 2014-07-14 DIAGNOSIS — F419 Anxiety disorder, unspecified: Secondary | ICD-10-CM

## 2014-07-14 DIAGNOSIS — Z1211 Encounter for screening for malignant neoplasm of colon: Secondary | ICD-10-CM | POA: Diagnosis not present

## 2014-07-14 DIAGNOSIS — R7302 Impaired glucose tolerance (oral): Secondary | ICD-10-CM | POA: Diagnosis not present

## 2014-07-14 DIAGNOSIS — F418 Other specified anxiety disorders: Secondary | ICD-10-CM | POA: Diagnosis not present

## 2014-07-14 DIAGNOSIS — R05 Cough: Secondary | ICD-10-CM

## 2014-07-14 DIAGNOSIS — E7800 Pure hypercholesterolemia, unspecified: Secondary | ICD-10-CM

## 2014-07-14 DIAGNOSIS — Z0000 Encounter for general adult medical examination without abnormal findings: Secondary | ICD-10-CM

## 2014-07-14 DIAGNOSIS — F341 Dysthymic disorder: Secondary | ICD-10-CM | POA: Diagnosis not present

## 2014-07-14 DIAGNOSIS — F329 Major depressive disorder, single episode, unspecified: Secondary | ICD-10-CM

## 2014-07-14 DIAGNOSIS — M8580 Other specified disorders of bone density and structure, unspecified site: Secondary | ICD-10-CM

## 2014-07-14 DIAGNOSIS — Z01419 Encounter for gynecological examination (general) (routine) without abnormal findings: Secondary | ICD-10-CM | POA: Diagnosis not present

## 2014-07-14 DIAGNOSIS — R059 Cough, unspecified: Secondary | ICD-10-CM

## 2014-07-14 LAB — CBC WITH DIFFERENTIAL/PLATELET
Basophils Absolute: 0.1 10*3/uL (ref 0.0–0.1)
Basophils Relative: 1 % (ref 0–1)
EOS ABS: 0.1 10*3/uL (ref 0.0–0.7)
Eosinophils Relative: 2 % (ref 0–5)
HCT: 40.2 % (ref 36.0–46.0)
Hemoglobin: 13.1 g/dL (ref 12.0–15.0)
LYMPHS ABS: 1.4 10*3/uL (ref 0.7–4.0)
LYMPHS PCT: 25 % (ref 12–46)
MCH: 29 pg (ref 26.0–34.0)
MCHC: 32.6 g/dL (ref 30.0–36.0)
MCV: 89.1 fL (ref 78.0–100.0)
MPV: 11.2 fL (ref 8.6–12.4)
Monocytes Absolute: 0.3 10*3/uL (ref 0.1–1.0)
Monocytes Relative: 6 % (ref 3–12)
NEUTROS ABS: 3.7 10*3/uL (ref 1.7–7.7)
Neutrophils Relative %: 66 % (ref 43–77)
PLATELETS: 279 10*3/uL (ref 150–400)
RBC: 4.51 MIL/uL (ref 3.87–5.11)
RDW: 13.2 % (ref 11.5–15.5)
WBC: 5.6 10*3/uL (ref 4.0–10.5)

## 2014-07-14 LAB — POCT URINALYSIS DIPSTICK
Bilirubin, UA: NEGATIVE
GLUCOSE UA: NEGATIVE
Ketones, UA: NEGATIVE
Leukocytes, UA: NEGATIVE
NITRITE UA: NEGATIVE
PROTEIN UA: NEGATIVE
Spec Grav, UA: 1.015
UROBILINOGEN UA: 0.2
pH, UA: 6

## 2014-07-14 LAB — COMPREHENSIVE METABOLIC PANEL
ALT: 13 U/L (ref 0–35)
AST: 16 U/L (ref 0–37)
Albumin: 4.1 g/dL (ref 3.5–5.2)
Alkaline Phosphatase: 43 U/L (ref 39–117)
BILIRUBIN TOTAL: 0.6 mg/dL (ref 0.2–1.2)
BUN: 25 mg/dL — AB (ref 6–23)
CHLORIDE: 107 meq/L (ref 96–112)
CO2: 23 meq/L (ref 19–32)
Calcium: 9.7 mg/dL (ref 8.4–10.5)
Creat: 1.26 mg/dL — ABNORMAL HIGH (ref 0.50–1.10)
GLUCOSE: 90 mg/dL (ref 70–99)
Potassium: 4.5 mEq/L (ref 3.5–5.3)
Sodium: 140 mEq/L (ref 135–145)
Total Protein: 6.4 g/dL (ref 6.0–8.3)

## 2014-07-14 LAB — LIPID PANEL
CHOL/HDL RATIO: 2.9 ratio
Cholesterol: 169 mg/dL (ref 0–200)
HDL: 59 mg/dL (ref >=46)
LDL Cholesterol: 94 mg/dL (ref 0–99)
Triglycerides: 80 mg/dL (ref <150)
VLDL: 16 mg/dL (ref 0–40)

## 2014-07-14 LAB — HEMOGLOBIN A1C
HEMOGLOBIN A1C: 5.8 % — AB (ref <5.7)
Mean Plasma Glucose: 120 mg/dL — ABNORMAL HIGH (ref <117)

## 2014-07-14 LAB — TSH: TSH: 2.27 u[IU]/mL (ref 0.350–4.500)

## 2014-07-14 MED ORDER — ATORVASTATIN CALCIUM 10 MG PO TABS: 10.0000 mg | ORAL_TABLET | Freq: Every day | ORAL | Status: DC

## 2014-07-14 MED ORDER — ESCITALOPRAM OXALATE 10 MG PO TABS: 10.0000 mg | ORAL_TABLET | Freq: Every day | ORAL | Status: DC

## 2014-07-14 MED ORDER — LOSARTAN POTASSIUM 25 MG PO TABS: 25.0000 mg | ORAL_TABLET | Freq: Every day | ORAL | Status: DC

## 2014-07-14 MED ORDER — OMEPRAZOLE 40 MG PO CPDR: 40.0000 mg | DELAYED_RELEASE_CAPSULE | Freq: Every day | ORAL | Status: DC

## 2014-07-14 NOTE — Progress Notes (Signed)
Subjective:    Patient ID: Margaret Massey, female    DOB: May 09, 1950, 64 y.o.   MRN: [id number]  07/14/2014  Annual Exam and Medication Refill   HPI This 64 y.o. female presents for Annual Wellness Examination and six month follow-up of chronic medical conditions.  Last physical:  02-27-13 Pap smear:  02-27-13 Mammogram:  11-13-13 [place].   Colonoscopy:  07-15-2002; overdue.  [place].  Normal.   Bone density:  11-12-2013; did have recently. TDAP:  2012 Pneumovax:  2015; 2015  Zostavax:  2015 Influenza:  2015 Eye exam:  08-2013; no glaucoma or cataracts; +contacts Dental exam:  Every six months.  Hypercholesterolemia: Patient reports good compliance with medication, good tolerance to medication, and good symptom control.   Denies HA/dizziness/focal weakness/paresthesias.  Glucose Intolerance:  Monitoring sugar intake; due for labs; not exercising.   Anxiety and depression:  Patient reports good compliance with medication, good tolerance to medication, and good symptom control.  Margaret Massey is 64 years-old; going to boarding school in [place] in 10/2014.  Son is still going through nasty divorce; ex-daughter-in-law is living with boyfriend.  Blood pressure elevated:  Home readings running 130s/80s.  Renal cell carcinoma:  Last visit with [facility] 08-14-2013.  Last CXR normal other than new compression fracture at T7.  Osteoporosis:  +compression fracture on CXR in 2015; s/p bone density scan; treatment includes Calcium plus D, exercise.  Non-compliant with exercise.   Review of Systems  Constitutional: Negative for fever, chills, diaphoresis, activity change, appetite change, fatigue and unexpected weight change.  HENT: Negative for congestion, dental problem, drooling, ear discharge, ear pain, facial swelling, hearing loss, mouth sores, nosebleeds, postnasal drip, rhinorrhea, sinus pressure, sneezing, sore throat, tinnitus, trouble swallowing and voice change.   Eyes: Negative for photophobia,  pain, discharge, redness, itching and visual disturbance.  Respiratory: Negative for apnea, cough, choking, chest tightness, shortness of breath, wheezing and stridor.   Cardiovascular: Negative for chest pain, palpitations and leg swelling.  Gastrointestinal: Negative for nausea, vomiting, abdominal pain, diarrhea, constipation, blood in stool, abdominal distention, anal bleeding and rectal pain.  Endocrine: Negative for cold intolerance, heat intolerance, polydipsia, polyphagia and polyuria.  Genitourinary: Negative for dysuria, urgency, frequency, hematuria, flank pain, decreased urine volume, vaginal bleeding, vaginal discharge, enuresis, difficulty urinating, genital sores, vaginal pain, menstrual problem, pelvic pain and dyspareunia.  Musculoskeletal: Negative for myalgias, back pain, joint swelling, arthralgias, gait problem, neck pain and neck stiffness.  Skin: Negative for color change, pallor, rash and wound.  Allergic/Immunologic: Negative for environmental allergies, food allergies and immunocompromised state.  Neurological: Negative for dizziness, tremors, seizures, syncope, facial asymmetry, speech difficulty, weakness, light-headedness, numbness and headaches.  Hematological: Negative for adenopathy. Does not bruise/bleed easily.  Psychiatric/Behavioral: Negative for suicidal ideas, hallucinations, behavioral problems, confusion, sleep disturbance, self-injury, dysphoric mood, decreased concentration and agitation. The patient is nervous/anxious. The patient is not hyperactive.     Past Medical History  Diagnosis Date  . Chronic kidney disease   . Anxiety   . Cancer     Renal Cell Carcinoma s/p nephrectomy  . Hyperlipidemia   . Osteoporosis   . Hematuria, unspecified    Past Surgical History  Procedure Laterality Date  . Right kidney removal      Renal Cell Carcinoma.  . Jaw surgery     Allergies  Allergen Reactions  . Morphine And Related Nausea And Vomiting    History   Social History  . Marital Status: Married    Spouse Name: N/A  . Number  of Children: 2  . Years of Education: college   Occupational History  . 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 Smoking status: Never Smoker   . Smokeless tobacco: Never Used  . Alcohol Use: 2.4 - 3.0 oz/week    4-5 Glasses of wine per week     Comment: 1 glass of wine per month on average.  . Drug Use: No  . Sexual Activity: Yes    Birth Control/ Protection: Post-menopausal   Other Topics Concern  . Not on file   Social History Narrative   Marital status: married x 44 years.      Children: 2 children: 4 grandchildren.      Lives: with husband, son, 2 grandchildren.      Employment:  Sales promotion account executive for [employer].      Tobacco: never      Alcohol: glass of wine 4-5 times a year.      Drugs: none     Education:College. Exercise:  None/Inactive   Family History  Problem Relation Age of Onset  . Heart disease Father   . Heart disease Sister     pacemaker  . Diabetes Sister   . Hyperlipidemia Sister   . Hypertension Mother   . Depression Mother   . Osteoporosis Mother   . Heart disease Maternal Grandmother   . Heart disease Maternal Grandfather         Objective:    BP 130/90 mmHg  Pulse 81  Temp(Src) 97.6 F (36.4 C) (Oral)  Resp 16  Ht 5' 3.5" (1.613 m)  Wt 155 lb 9.6 oz (70.58 kg)  BMI 27.13 kg/m2  SpO2 96% Physical Exam  Constitutional: She is oriented to person, place, and time. She appears well-developed and well-nourished. No distress.  HENT:  Head: Normocephalic and atraumatic.  Right Ear: External ear normal.  Left Ear: External ear normal.  Nose: Nose normal.  Mouth/Throat: Oropharynx is clear and moist.  Eyes: Conjunctivae and EOM are normal. Pupils are equal, round, and reactive to light.  Neck: Normal range of motion and full passive range of motion without pain. Neck supple. No JVD present. Carotid bruit is not  present. No thyromegaly present.  Cardiovascular: Normal rate, regular rhythm and normal heart sounds.  Exam reveals no gallop and no friction rub.   No murmur heard. Pulmonary/Chest: Effort normal and breath sounds normal. She has no wheezes. She has no rales. Right breast exhibits no inverted nipple, no mass, no nipple discharge, no skin change and no tenderness. Left breast exhibits no inverted nipple, no mass, no nipple discharge, no skin change and no tenderness. Breasts are symmetrical.  Abdominal: Soft. Bowel sounds are normal. She exhibits no distension and no mass. There is no tenderness. There is no rebound and no guarding.  Genitourinary: Vagina normal. There is no rash, tenderness, lesion or injury on the right labia. There is no rash, tenderness, lesion or injury on the left labia.  Musculoskeletal:       Right shoulder: Normal.       Left shoulder: Normal.       Cervical back: Normal.  Lymphadenopathy:    She has no cervical adenopathy.  Neurological: She is alert and oriented to person, place, and time. She has normal reflexes. No cranial nerve deficit. She exhibits normal muscle tone. Coordination normal.  Skin: Skin is warm and dry. No rash noted. She is not diaphoretic. No erythema. No pallor.  Psychiatric: She has a normal mood and affect. Her behavior is normal. Judgment and thought content normal.  Nursing note and vitals reviewed.  Results for orders placed or performed in visit on 01/22/14  CBC with Differential  Result Value Ref Range   WBC 7.7 4.0 - 10.5 K/uL   RBC 4.40 3.87 - 5.11 MIL/uL   Hemoglobin 12.6 12.0 - 15.0 g/dL   HCT 38.3 36.0 - 46.0 %   MCV 87.0 78.0 - 100.0 fL   MCH 28.6 26.0 - 34.0 pg   MCHC 32.9 30.0 - 36.0 g/dL   RDW 13.4 11.5 - 15.5 %   Platelets 304 150 - 400 K/uL   Neutrophils Relative % 67 43 - 77 %   Neutro Abs 5.2 1.7 - 7.7 K/uL   Lymphocytes Relative 24 12 - 46 %   Lymphs Abs 1.8 0.7 - 4.0 K/uL   Monocytes Relative 7 3 - 12 %    Monocytes Absolute 0.5 0.1 - 1.0 K/uL   Eosinophils Relative 1 0 - 5 %   Eosinophils Absolute 0.1 0.0 - 0.7 K/uL   Basophils Relative 1 0 - 1 %   Basophils Absolute 0.1 0.0 - 0.1 K/uL   Smear Review Criteria for review not met   COMPLETE METABOLIC PANEL WITH GFR  Result Value Ref Range   Sodium 142 135 - 145 mEq/L   Potassium 4.4 3.5 - 5.3 mEq/L   Chloride 107 96 - 112 mEq/L   CO2 24 19 - 32 mEq/L   Glucose, Bld 88 70 - 99 mg/dL   BUN 22 6 - 23 mg/dL   Creat 1.11 (H) 0.50 - 1.10 mg/dL   Total Bilirubin 0.5 0.2 - 1.2 mg/dL   Alkaline Phosphatase 40 39 - 117 U/L   AST 16 0 - 37 U/L   ALT 15 0 - 35 U/L   Total Protein 6.2 6.0 - 8.3 g/dL   Albumin 3.9 3.5 - 5.2 g/dL   Calcium 9.5 8.4 - 10.5 mg/dL   GFR, Est African American 60 mL/min   GFR, Est Non African American 52 (L) mL/min  Hemoglobin A1c  Result Value Ref Range   Hgb A1c MFr Bld 5.9 (H) <5.7 %   Mean Plasma Glucose 123 (H) <117 mg/dL  Lipid panel  Result Value Ref Range   Cholesterol 146 0 - 200 mg/dL   Triglycerides 86 <150 mg/dL   HDL 57 >39 mg/dL   Total CHOL/HDL Ratio 2.6 Ratio   VLDL 17 0 - 40 mg/dL   LDL Cholesterol 72 0 - 99 mg/dL       Assessment & Plan:   1. Encounter for Medicare annual wellness exam   2. Renal cell carcinoma, unspecified laterality   3. Pure hypercholesterolemia   4. Osteopenia   5. Female genuine stress incontinence   6. Anxiety and depression   7. Glucose intolerance (impaired glucose tolerance)   8. Colon cancer screening   9. Essential hypertension, benign   10. Cough   11. Encounter for routine gynecological examination    1. Medicare Annual Wellness Exam: anticipatory guidance --- exercise, weight loss, 3 servings of calcium daily.  Pap smear obtained.  Mammogram and bone density UTD.  Refer for repeat colonoscopy.  Immunizations UTD.  Undergoing treatment for depression. No hearing loss.  Low fall risk.  Independent with ADLs.  FULL CODE. 2.  Gynecological exam: pap smear  obtained; mammogram UTD. 3.  Hypercholesterolemia: controlled; obtain labs; refill provided. 4.  R renal  cell carcinoma: stable; followed by [facility] annually. 5.  Osteopenia: stable; bone density UTD; encourage daily weight bearing exercise and 3 servings of calcium daily. 6.  Anxiety and depression: worsening due to son's divorce and family stressors; increase Lexapro to 35m daily. 7.  [facility]  HTN: new; obtain labs; rx for Losartan 230mdaily provided; avoid ACE-I due to chronic cough. 9. Cough: improved with daily PPI.   Meds ordered this encounter  Medications  . losartan (COZAAR) 25 MG tablet    Sig: Take 1 tablet (25 mg total) by mouth daily.    Dispense:  90 tablet    Refill:  3  . atorvastatin (LIPITOR) 10 MG tablet    Sig: Take 1 tablet (10 mg total) by mouth daily.    Dispense:  90 tablet    Refill:  3  . omeprazole (PRILOSEC) 40 MG capsule    Sig: Take 1 capsule (40 mg total) by mouth daily.    Dispense:  90 capsule    Refill:  3  . escitalopram (LEXAPRO) 10 MG tablet    Sig: Take 1-2 tablets (10-20 mg total) by mouth daily.    Dispense:  180 tablet    Refill:  3    Return in about 6 months (around 01/14/2015) for recheck high cholesterol, anxiety.     Kristi MaElayne GuerinM.D. [facility] MeJohnsonburg[address]er[address]rL[place]  [phone] [phone]

## 2014-07-14 NOTE — Patient Instructions (Signed)

## 2014-07-14 NOTE — Progress Notes (Signed)
   Subjective:    Patient ID: Margaret Massey, female    DOB: December 06, 1950, 64 y.o.   MRN: [id number]  HPI    Review of Systems  Constitutional: Negative.   HENT: Negative.   Eyes: Negative.   Respiratory: Negative.   Cardiovascular: Negative.   Gastrointestinal: Negative.   Endocrine: Negative.   Genitourinary: Negative.   Musculoskeletal: Negative.   Skin: Negative.   Allergic/Immunologic: Negative.   Neurological: Negative.   Hematological: Negative.   Psychiatric/Behavioral: Negative.        Objective:   Physical Exam        Assessment & Plan:

## 2014-07-15 LAB — PAP IG (IMAGE GUIDED)

## 2014-07-30 ENCOUNTER — Encounter: Payer: Self-pay | Admitting: Family Medicine

## 2014-08-01 DIAGNOSIS — L853 Xerosis cutis: Secondary | ICD-10-CM | POA: Diagnosis not present

## 2014-08-01 DIAGNOSIS — D485 Neoplasm of uncertain behavior of skin: Secondary | ICD-10-CM | POA: Diagnosis not present

## 2014-08-01 DIAGNOSIS — L820 Inflamed seborrheic keratosis: Secondary | ICD-10-CM | POA: Diagnosis not present

## 2014-08-01 DIAGNOSIS — L821 Other seborrheic keratosis: Secondary | ICD-10-CM | POA: Diagnosis not present

## 2014-08-01 DIAGNOSIS — L570 Actinic keratosis: Secondary | ICD-10-CM | POA: Diagnosis not present

## 2014-09-04 DIAGNOSIS — C649 Malignant neoplasm of unspecified kidney, except renal pelvis: Secondary | ICD-10-CM | POA: Diagnosis not present

## 2014-09-04 DIAGNOSIS — N2889 Other specified disorders of kidney and ureter: Secondary | ICD-10-CM | POA: Diagnosis not present

## 2014-09-04 DIAGNOSIS — Z85528 Personal history of other malignant neoplasm of kidney: Secondary | ICD-10-CM | POA: Diagnosis not present

## 2014-09-04 DIAGNOSIS — Z905 Acquired absence of kidney: Secondary | ICD-10-CM | POA: Diagnosis not present

## 2014-09-04 DIAGNOSIS — N281 Cyst of kidney, acquired: Secondary | ICD-10-CM | POA: Diagnosis not present

## 2014-10-03 DIAGNOSIS — N281 Cyst of kidney, acquired: Secondary | ICD-10-CM | POA: Diagnosis not present

## 2014-10-03 DIAGNOSIS — N3020 Other chronic cystitis without hematuria: Secondary | ICD-10-CM | POA: Diagnosis not present

## 2014-10-03 DIAGNOSIS — R312 Other microscopic hematuria: Secondary | ICD-10-CM | POA: Diagnosis not present

## 2014-10-03 DIAGNOSIS — Z85528 Personal history of other malignant neoplasm of kidney: Secondary | ICD-10-CM | POA: Diagnosis not present

## 2014-11-21 DIAGNOSIS — H269 Unspecified cataract: Secondary | ICD-10-CM | POA: Diagnosis not present

## 2015-01-19 ENCOUNTER — Encounter: Payer: Self-pay | Admitting: Family Medicine

## 2015-01-19 ENCOUNTER — Ambulatory Visit (INDEPENDENT_AMBULATORY_CARE_PROVIDER_SITE_OTHER): Payer: Medicare Other | Admitting: Family Medicine

## 2015-01-19 VITALS — BP 148/77 | HR 59 | Temp 98.0°F | Resp 16 | Ht 63.5 in | Wt 153.2 lb

## 2015-01-19 DIAGNOSIS — Z23 Encounter for immunization: Secondary | ICD-10-CM

## 2015-01-19 DIAGNOSIS — C641 Malignant neoplasm of right kidney, except renal pelvis: Secondary | ICD-10-CM | POA: Diagnosis not present

## 2015-01-19 DIAGNOSIS — E7800 Pure hypercholesterolemia, unspecified: Secondary | ICD-10-CM | POA: Diagnosis not present

## 2015-01-19 DIAGNOSIS — F418 Other specified anxiety disorders: Secondary | ICD-10-CM | POA: Diagnosis not present

## 2015-01-19 DIAGNOSIS — F329 Major depressive disorder, single episode, unspecified: Secondary | ICD-10-CM

## 2015-01-19 DIAGNOSIS — R7309 Other abnormal glucose: Secondary | ICD-10-CM

## 2015-01-19 DIAGNOSIS — Z1159 Encounter for screening for other viral diseases: Secondary | ICD-10-CM

## 2015-01-19 DIAGNOSIS — I10 Essential (primary) hypertension: Secondary | ICD-10-CM | POA: Diagnosis not present

## 2015-01-19 DIAGNOSIS — F419 Anxiety disorder, unspecified: Secondary | ICD-10-CM

## 2015-01-19 LAB — CBC WITH DIFFERENTIAL/PLATELET
Basophils Absolute: 0.1 10*3/uL (ref 0.0–0.1)
Basophils Relative: 1 % (ref 0–1)
Eosinophils Absolute: 0.1 10*3/uL (ref 0.0–0.7)
Eosinophils Relative: 2 % (ref 0–5)
HCT: 41.1 % (ref 36.0–46.0)
HEMOGLOBIN: 13.7 g/dL (ref 12.0–15.0)
Lymphocytes Relative: 23 % (ref 12–46)
Lymphs Abs: 1.4 10*3/uL (ref 0.7–4.0)
MCH: 29.1 pg (ref 26.0–34.0)
MCHC: 33.3 g/dL (ref 30.0–36.0)
MCV: 87.4 fL (ref 78.0–100.0)
MONOS PCT: 5 % (ref 3–12)
MPV: 11.3 fL (ref 8.6–12.4)
Monocytes Absolute: 0.3 10*3/uL (ref 0.1–1.0)
NEUTROS ABS: 4.2 10*3/uL (ref 1.7–7.7)
NEUTROS PCT: 69 % (ref 43–77)
Platelets: 283 10*3/uL (ref 150–400)
RBC: 4.7 MIL/uL (ref 3.87–5.11)
RDW: 12.6 % (ref 11.5–15.5)
WBC: 6.1 10*3/uL (ref 4.0–10.5)

## 2015-01-19 LAB — LIPID PANEL
CHOL/HDL RATIO: 2.3 ratio (ref <=5.0)
Cholesterol: 152 mg/dL (ref 125–200)
HDL: 67 mg/dL (ref >=46)
LDL CALC: 65 mg/dL (ref <130)
TRIGLYCERIDES: 101 mg/dL (ref <150)
VLDL: 20 mg/dL (ref <30)

## 2015-01-19 LAB — COMPREHENSIVE METABOLIC PANEL
ALT: 14 U/L (ref 6–29)
AST: 17 U/L (ref 10–35)
Albumin: 4.1 g/dL (ref 3.6–5.1)
Alkaline Phosphatase: 46 U/L (ref 33–130)
BILIRUBIN TOTAL: 0.7 mg/dL (ref 0.2–1.2)
BUN: 22 mg/dL (ref 7–25)
CHLORIDE: 105 mmol/L (ref 98–110)
CO2: 26 mmol/L (ref 20–31)
CREATININE: 1.23 mg/dL — AB (ref 0.50–0.99)
Calcium: 9.5 mg/dL (ref 8.6–10.4)
Glucose, Bld: 95 mg/dL (ref 65–99)
Potassium: 4.5 mmol/L (ref 3.5–5.3)
SODIUM: 141 mmol/L (ref 135–146)
TOTAL PROTEIN: 6.5 g/dL (ref 6.1–8.1)

## 2015-01-19 LAB — HEPATITIS C ANTIBODY: HCV Ab: NEGATIVE

## 2015-01-19 LAB — HEMOGLOBIN A1C
Hgb A1c MFr Bld: 5.8 % — ABNORMAL HIGH (ref <5.7)
Mean Plasma Glucose: 120 mg/dL — ABNORMAL HIGH (ref <117)

## 2015-01-19 MED ORDER — LOSARTAN POTASSIUM 50 MG PO TABS: 50.0000 mg | ORAL_TABLET | Freq: Every day | ORAL | Status: DC

## 2015-01-19 NOTE — Progress Notes (Signed)
Subjective:    Patient ID: Margaret Massey, female    DOB: [birth year], 64 y.o.   MRN: [id number]  01/19/2015  Follow-up   HPI This 64 y.o. female presents for six month follow-up:  1.  HTN:  Patient reports good compliance with medication, good tolerance to medication, and good symptom control.    2.  Hyperlipidemia: Patient reports good compliance with medication, good tolerance to medication, and good symptom control.    3.  Anxiety:  Patient reports good compliance with medication, good tolerance to medication, and good symptom control.   Continues to experience son with divorce which is really ugly.  4.  GERD: Patient reports good compliance with medication, good tolerance to medication, and good symptom control.   Never had reflux; has suffered with chronic cough that is stable at this time.   5. Renal cell carcinoma: s/p recent follow-up with Dr. Jacqlyn Larsen; s/p CXR.  6.  Scheduled for colonoscopy on 01/29/15.    Review of Systems  Constitutional: Negative for fever, chills, diaphoresis and fatigue.  Eyes: Negative for visual disturbance.  Respiratory: Positive for cough. Negative for shortness of breath.   Cardiovascular: Negative for chest pain, palpitations and leg swelling.  Gastrointestinal: Negative for nausea, vomiting, abdominal pain, diarrhea and constipation.  Endocrine: Negative for cold intolerance, heat intolerance, polydipsia, polyphagia and polyuria.  Neurological: Negative for dizziness, tremors, seizures, syncope, facial asymmetry, speech difficulty, weakness, light-headedness, numbness and headaches.  Psychiatric/Behavioral: Negative for suicidal ideas, sleep disturbance, self-injury and dysphoric mood. The patient is not nervous/anxious.     Past Medical History  Diagnosis Date  . Chronic kidney disease   . Anxiety   . Cancer [facility])     Renal Cell Carcinoma s/p nephrectomy  . Hyperlipidemia   . Osteoporosis   . Hematuria, unspecified    Past Surgical  History  Procedure Laterality Date  . Right kidney removal      Renal Cell Carcinoma.  . Jaw surgery     Allergies  Allergen Reactions  . Morphine And Related Nausea And Vomiting    Social History   Social History  . Marital Status: Married    Spouse Name: N/A  . Number of Children: 2  . Years of Education: college   Occupational History  . 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 Smoking status: Never Smoker   . Smokeless tobacco: Never Used  . Alcohol Use: 2.4 - 3.0 oz/week    4-5 Glasses of wine per week     Comment: 1 glass of wine per month on average.  . Drug Use: No  . Sexual Activity: Yes    Birth Control/ Protection: Post-menopausal   Other Topics Concern  . Not on file   Social History Narrative   Marital status: married x 44 years.      Children: 2 children: 4 grandchildren.      Lives: with husband, son, 2 grandchildren.      Employment:  Sales promotion account executive for [employer].      Tobacco: never      Alcohol: glass of wine 4-5 times a year.      Drugs: none     Education:College. Exercise:  None/Inactive   Family History  Problem Relation Age of Onset  . Heart disease Father   . Heart disease Sister     pacemaker  . Diabetes Sister   . Hyperlipidemia Sister   . Hypertension Mother   .  Depression Mother   . Osteoporosis Mother   . Heart disease Maternal Grandmother   . Heart disease Maternal Grandfather        Objective:    BP 148/77 mmHg  Pulse 59  Temp(Src) 98 F (36.7 C) (Oral)  Resp 16  Ht 5' 3.5" (1.613 m)  Wt 153 lb 3.2 oz (69.491 kg)  BMI 26.71 kg/m2 Physical Exam  Constitutional: She is oriented to person, place, and time. She appears well-developed and well-nourished. No distress.  HENT:  Head: Normocephalic and atraumatic.  Right Ear: External ear normal.  Left Ear: External ear normal.  Nose: Nose normal.  Mouth/Throat: Oropharynx is clear and moist.  Eyes: Conjunctivae and EOM are  normal. Pupils are equal, round, and reactive to light.  Neck: Normal range of motion. Neck supple. Carotid bruit is not present. No thyromegaly present.  Cardiovascular: Normal rate, regular rhythm, normal heart sounds and intact distal pulses.  Exam reveals no gallop and no friction rub.   No murmur heard. Pulmonary/Chest: Effort normal and breath sounds normal. She has no wheezes. She has no rales.  Abdominal: Soft. Bowel sounds are normal. She exhibits no distension and no mass. There is no tenderness. There is no rebound and no guarding.  Lymphadenopathy:    She has no cervical adenopathy.  Neurological: She is alert and oriented to person, place, and time. No cranial nerve deficit.  Skin: Skin is warm and dry. No rash noted. She is not diaphoretic. No erythema. No pallor.  Psychiatric: She has a normal mood and affect. Her behavior is normal.   Results for orders placed or performed in visit on 07/14/14  CBC with Differential/Platelet  Result Value Ref Range   WBC 5.6 4.0 - 10.5 K/uL   RBC 4.51 3.87 - 5.11 MIL/uL   Hemoglobin 13.1 12.0 - 15.0 g/dL   HCT 40.2 36.0 - 46.0 %   MCV 89.1 78.0 - 100.0 fL   MCH 29.0 26.0 - 34.0 pg   MCHC 32.6 30.0 - 36.0 g/dL   RDW 13.2 11.5 - 15.5 %   Platelets 279 150 - 400 K/uL   MPV 11.2 8.6 - 12.4 fL   Neutrophils Relative % 66 43 - 77 %   Neutro Abs 3.7 1.7 - 7.7 K/uL   Lymphocytes Relative 25 12 - 46 %   Lymphs Abs 1.4 0.7 - 4.0 K/uL   Monocytes Relative 6 3 - 12 %   Monocytes Absolute 0.3 0.1 - 1.0 K/uL   Eosinophils Relative 2 0 - 5 %   Eosinophils Absolute 0.1 0.0 - 0.7 K/uL   Basophils Relative 1 0 - 1 %   Basophils Absolute 0.1 0.0 - 0.1 K/uL   Smear Review Criteria for review not met   Comprehensive metabolic panel  Result Value Ref Range   Sodium 140 135 - 145 mEq/L   Potassium 4.5 3.5 - 5.3 mEq/L   Chloride 107 96 - 112 mEq/L   CO2 23 19 - 32 mEq/L   Glucose, Bld 90 70 - 99 mg/dL   BUN 25 (H) 6 - 23 mg/dL   Creat 1.26 (H)  0.50 - 1.10 mg/dL   Total Bilirubin 0.6 0.2 - 1.2 mg/dL   Alkaline Phosphatase 43 39 - 117 U/L   AST 16 0 - 37 U/L   ALT 13 0 - 35 U/L   Total Protein 6.4 6.0 - 8.3 g/dL   Albumin 4.1 3.5 - 5.2 g/dL   Calcium 9.7 8.4 - 10.5  mg/dL  Hemoglobin A1c  Result Value Ref Range   Hgb A1c MFr Bld 5.8 (H) <5.7 %   Mean Plasma Glucose 120 (H) <117 mg/dL  Lipid panel  Result Value Ref Range   Cholesterol 169 0 - 200 mg/dL   Triglycerides 80 <150 mg/dL   HDL 59 >=46 mg/dL   Total CHOL/HDL Ratio 2.9 Ratio   VLDL 16 0 - 40 mg/dL   LDL Cholesterol 94 0 - 99 mg/dL  TSH  Result Value Ref Range   TSH 2.270 0.350 - 4.500 uIU/mL  POCT urinalysis dipstick  Result Value Ref Range   Color, UA yellow    Clarity, UA clear    Glucose, UA neg    Bilirubin, UA neg    Ketones, UA neg    Spec Grav, UA 1.015    Blood, UA trace    pH, UA 6.0    Protein, UA neg    Urobilinogen, UA 0.2    Nitrite, UA neg    Leukocytes, UA Negative   Pap IG (Image Guided)  Result Value Ref Range   Specimen adequacy: SEE NOTE    FINAL DIAGNOSIS: SEE NOTE    Cytotechnologist: SEE NOTE    Influenza vaccine administered by Caren Griffins, [place]:   1. Pure hypercholesterolemia   2. Renal cell carcinoma, right (HCC)   3. Other abnormal glucose   4. Anxiety and depression   5. Essential hypertension   6. Need for hepatitis C screening test   7. Need for prophylactic vaccination and inoculation against influenza    1. Hypercholesterolemia: controlled; obtain labs; continue medication. 2.  RCC R: s/p recent follow-up with [facility]. 3.  Glucose intolerance: stable; obtain labs; continue with dietary modification. 4.  Anxiety and depression: stable; coping with stress of son's divorce well; no change in mangement. 5.  HTN: uncontrolled; increase Losartan to 33m daily; obtain labs. 6.  Need for hepatitis C: obtain. 7. S/p influenza vaccine.   Orders Placed This Encounter  Procedures  . Flu Vaccine QUAD 36+  mos IM  . CBC with Differential/Platelet  . Comprehensive metabolic panel    Order Specific Question:  Has the patient fasted?    Answer:  Yes  . Hemoglobin A1c  . Lipid panel    Order Specific Question:  Has the patient fasted?    Answer:  Yes  . Hepatitis C antibody   Meds ordered this encounter  Medications  . losartan (COZAAR) 50 MG tablet    Sig: Take 1 tablet (50 mg total) by mouth daily.    Dispense:  90 tablet    Refill:  3    Return in about 6 months (around 07/19/2015) for complete physical examiniation.    Donnis Phaneuf MElayne Guerin M.D. Ur[facility] MNew Florence15[address] LaneGGarland [place]  [phone]

## 2015-01-19 NOTE — Patient Instructions (Signed)
Hypertension Hypertension, commonly called high blood pressure, is when the force of blood pumping through your arteries is too strong. Your arteries are the blood vessels that carry blood from your heart throughout your body. A blood pressure reading consists of a higher number over a lower number, such as 110/72. The higher number (systolic) is the pressure inside your arteries when your heart pumps. The lower number (diastolic) is the pressure inside your arteries when your heart relaxes. Ideally you want your blood pressure below 120/80. Hypertension forces your heart to work harder to pump blood. Your arteries may become narrow or stiff. Having untreated or uncontrolled hypertension can cause heart attack, stroke, kidney disease, and other problems. RISK FACTORS Some risk factors for high blood pressure are controllable. Others are not.  Risk factors you cannot control include:   Race. You may be at higher risk if you are African American.  Age. Risk increases with age.  Gender. Men are at higher risk than women before age 45 years. After age 65, women are at higher risk than men. Risk factors you can control include:  Not getting enough exercise or physical activity.  Being overweight.  Getting too much fat, sugar, calories, or salt in your diet.  Drinking too much alcohol. SIGNS AND SYMPTOMS Hypertension does not usually cause signs or symptoms. Extremely high blood pressure (hypertensive crisis) may cause headache, anxiety, shortness of breath, and nosebleed. DIAGNOSIS To check if you have hypertension, your health care provider will measure your blood pressure while you are seated, with your arm held at the level of your heart. It should be measured at least twice using the same arm. Certain conditions can cause a difference in blood pressure between your right and left arms. A blood pressure reading that is higher than normal on one occasion does not mean that you need treatment. If  it is not clear whether you have high blood pressure, you may be asked to return on a different day to have your blood pressure checked again. Or, you may be asked to monitor your blood pressure at home for 1 or more weeks. TREATMENT Treating high blood pressure includes making lifestyle changes and possibly taking medicine. Living a healthy lifestyle can help lower high blood pressure. You may need to change some of your habits. Lifestyle changes may include:  Following the DASH diet. This diet is high in fruits, vegetables, and whole grains. It is low in salt, red meat, and added sugars.  Keep your sodium intake below 2,300 mg per day.  Getting at least 30-45 minutes of aerobic exercise at least 4 times per week.  Losing weight if necessary.  Not smoking.  Limiting alcoholic beverages.  Learning ways to reduce stress. Your health care provider may prescribe medicine if lifestyle changes are not enough to get your blood pressure under control, and if one of the following is true:  You are 18-59 years of age and your systolic blood pressure is above 140.  You are 60 years of age or older, and your systolic blood pressure is above 150.  Your diastolic blood pressure is above 90.  You have diabetes, and your systolic blood pressure is over 140 or your diastolic blood pressure is over 90.  You have kidney disease and your blood pressure is above 140/90.  You have heart disease and your blood pressure is above 140/90. Your personal target blood pressure may vary depending on your medical conditions, your age, and other factors. HOME CARE INSTRUCTIONS    Have your blood pressure rechecked as directed by your health care provider.   Take medicines only as directed by your health care provider. Follow the directions carefully. Blood pressure medicines must be taken as prescribed. The medicine does not work as well when you skip doses. Skipping doses also puts you at risk for  problems.  Do not smoke.   Monitor your blood pressure at home as directed by your health care provider. SEEK MEDICAL CARE IF:   You think you are having a reaction to medicines taken.  You have recurrent headaches or feel dizzy.  You have swelling in your ankles.  You have trouble with your vision. SEEK IMMEDIATE MEDICAL CARE IF:  You develop a severe headache or confusion.  You have unusual weakness, numbness, or feel faint.  You have severe chest or abdominal pain.  You vomit repeatedly.  You have trouble breathing. MAKE SURE YOU:   Understand these instructions.  Will watch your condition.  Will get help right away if you are not doing well or get worse.   This information is not intended to replace advice given to you by your health care provider. Make sure you discuss any questions you have with your health care provider.   Document Released: 02/28/2005 Document Revised: 07/15/2014 Document Reviewed: 12/21/2012 Elsevier Interactive Patient Education 2016 Elsevier Inc.  

## 2015-01-22 ENCOUNTER — Encounter: Payer: Self-pay | Admitting: Family Medicine

## 2015-01-29 ENCOUNTER — Ambulatory Visit
Admission: RE | Admit: 2015-01-29 | Discharge: 2015-01-29 | Disposition: A | Discharge status: Home / Self Care | Payer: Medicare Other | Source: Ambulatory Visit | Attending: Unknown Physician Specialty | Admitting: Unknown Physician Specialty

## 2015-01-29 ENCOUNTER — Ambulatory Visit: Payer: Medicare Other | Admitting: Anesthesiology

## 2015-01-29 ENCOUNTER — Encounter
Admission: RE | Disposition: A | Discharge status: Home / Self Care | Payer: Self-pay | Source: Ambulatory Visit | Attending: Unknown Physician Specialty

## 2015-01-29 DIAGNOSIS — E785 Hyperlipidemia, unspecified: Secondary | ICD-10-CM | POA: Diagnosis not present

## 2015-01-29 DIAGNOSIS — Z8249 Family history of ischemic heart disease and other diseases of the circulatory system: Secondary | ICD-10-CM | POA: Diagnosis not present

## 2015-01-29 DIAGNOSIS — K635 Polyp of colon: Secondary | ICD-10-CM | POA: Insufficient documentation

## 2015-01-29 DIAGNOSIS — D123 Benign neoplasm of transverse colon: Secondary | ICD-10-CM | POA: Insufficient documentation

## 2015-01-29 DIAGNOSIS — Z79899 Other long term (current) drug therapy: Secondary | ICD-10-CM | POA: Insufficient documentation

## 2015-01-29 DIAGNOSIS — M810 Age-related osteoporosis without current pathological fracture: Secondary | ICD-10-CM | POA: Insufficient documentation

## 2015-01-29 DIAGNOSIS — K5730 Diverticulosis of large intestine without perforation or abscess without bleeding: Secondary | ICD-10-CM | POA: Diagnosis not present

## 2015-01-29 DIAGNOSIS — N189 Chronic kidney disease, unspecified: Secondary | ICD-10-CM | POA: Insufficient documentation

## 2015-01-29 DIAGNOSIS — K640 First degree hemorrhoids: Secondary | ICD-10-CM | POA: Diagnosis not present

## 2015-01-29 DIAGNOSIS — Z8262 Family history of osteoporosis: Secondary | ICD-10-CM | POA: Insufficient documentation

## 2015-01-29 DIAGNOSIS — Z1211 Encounter for screening for malignant neoplasm of colon: Secondary | ICD-10-CM | POA: Insufficient documentation

## 2015-01-29 DIAGNOSIS — F419 Anxiety disorder, unspecified: Secondary | ICD-10-CM | POA: Diagnosis not present

## 2015-01-29 DIAGNOSIS — Z905 Acquired absence of kidney: Secondary | ICD-10-CM | POA: Insufficient documentation

## 2015-01-29 DIAGNOSIS — I10 Essential (primary) hypertension: Secondary | ICD-10-CM | POA: Diagnosis not present

## 2015-01-29 DIAGNOSIS — Z85528 Personal history of other malignant neoplasm of kidney: Secondary | ICD-10-CM | POA: Insufficient documentation

## 2015-01-29 DIAGNOSIS — K5790 Diverticulosis of intestine, part unspecified, without perforation or abscess without bleeding: Secondary | ICD-10-CM | POA: Diagnosis not present

## 2015-01-29 DIAGNOSIS — K648 Other hemorrhoids: Secondary | ICD-10-CM | POA: Diagnosis not present

## 2015-01-29 HISTORY — PX: COLONOSCOPY WITH PROPOFOL: SHX5780

## 2015-01-29 HISTORY — DX: Essential (primary) hypertension: I10

## 2015-01-29 HISTORY — DX: Polyp of colon: K63.5

## 2015-01-29 SURGERY — COLONOSCOPY WITH PROPOFOL
Anesthesia: General

## 2015-01-29 MED ORDER — SODIUM CHLORIDE 0.9 % IV SOLN: INTRAVENOUS | Status: DC

## 2015-01-29 MED ORDER — PROPOFOL 500 MG/50ML IV EMUL
INTRAVENOUS | Status: DC | PRN
  Administered 2015-01-29: 140 ug/kg/min via INTRAVENOUS

## 2015-01-29 MED ORDER — PROPOFOL 10 MG/ML IV BOLUS
INTRAVENOUS | Status: DC | PRN
  Administered 2015-01-29: 80 mg via INTRAVENOUS

## 2015-01-29 MED ORDER — KETOROLAC TROMETHAMINE 30 MG/ML IJ SOLN: INTRAMUSCULAR | Status: DC | PRN

## 2015-01-29 MED ORDER — SODIUM CHLORIDE 0.9 % IV SOLN
INTRAVENOUS | Status: DC | PRN
  Administered 2015-01-29: 08:00:00 via INTRAVENOUS

## 2015-01-29 MED ORDER — SODIUM CHLORIDE 0.9 % IV SOLN
INTRAVENOUS | Status: DC
  Administered 2015-01-29: 1000 mL via INTRAVENOUS

## 2015-01-29 NOTE — Anesthesia Preprocedure Evaluation (Signed)
Anesthesia Evaluation  Patient identified by MRN, date of birth, ID band Patient awake    Reviewed: Allergy & Precautions, H&P , NPO status , Patient's Chart, lab work & pertinent test results, reviewed documented beta blocker date and time   History of Anesthesia Complications (+) PONV and history of anesthetic complications  Airway Mallampati: I  TM Distance: >3 FB Neck ROM: full    Dental no notable dental hx. (+) Caps, Teeth Intact   Pulmonary neg pulmonary ROS,    Pulmonary exam normal breath sounds clear to auscultation       Cardiovascular Exercise Tolerance: Good hypertension, On Medications (-) angina(-) CAD, (-) Past MI, (-) Cardiac Stents and (-) CABG Normal cardiovascular exam(-) dysrhythmias (-) Valvular Problems/Murmurs Rhythm:regular Rate:Normal     Neuro/Psych PSYCHIATRIC DISORDERS (Depression and anxiety) negative neurological ROS     GI/Hepatic Neg liver ROS, GERD  Medicated and Controlled,  Endo/Other  negative endocrine ROS  Renal/GU CRFRenal diseaseRenal cell carcinoma s/p excision  negative genitourinary   Musculoskeletal   Abdominal   Peds  Hematology negative hematology ROS (+)   Anesthesia Other Findings Past Medical History:   Chronic kidney disease                                       Anxiety                                                      Cancer (HCC)                                                   Comment:Renal Cell Carcinoma s/p nephrectomy   Hyperlipidemia                                               Osteoporosis                                                 Hematuria, unspecified                                       Hypertension                                                 Reproductive/Obstetrics negative OB ROS                             Anesthesia Physical Anesthesia Plan  ASA: III  Anesthesia Plan: General   Post-op Pain  Management:    Induction:   Airway Management Planned:   Additional Equipment:  Intra-op Plan:   Post-operative Plan:   Informed Consent: I have reviewed the patients History and Physical, chart, labs and discussed the procedure including the risks, benefits and alternatives for the proposed anesthesia with the patient or authorized representative who has indicated his/her understanding and acceptance.   Dental Advisory Given  Plan Discussed with: Anesthesiologist, CRNA and Surgeon  Anesthesia Plan Comments:         Anesthesia Quick Evaluation

## 2015-01-29 NOTE — Op Note (Signed)
[facility] Gastroenterology Patient Name: Margaret Massey Procedure Date: 01/29/2015 7:51 AM MRN: [id number] Account #: [id number] Date of Birth: [birth year] Admit Type: Outpatient Age: 64 Room: [facility] ENDO ROOM 1 Gender: Female Note Status: Finalized Procedure:         Colonoscopy Indications:       Screening for colorectal malignant neoplasm Providers:         Manya Silvas, MD Referring MD:      Renette Butters. Tamala Julian, MD (Referring MD) Medicines:         Propofol per Anesthesia Complications:     No immediate complications. Procedure:         Pre-Anesthesia Assessment:                    - After reviewing the risks and benefits, the patient was                     deemed in satisfactory condition to undergo the procedure.                    After obtaining informed consent, the colonoscope was                     passed under direct vision. Throughout the procedure, the                     patient's blood pressure, pulse, and oxygen saturations                     were monitored continuously. The Colonoscope was                     introduced through the anus and advanced to the the cecum,                     identified by appendiceal orifice and ileocecal valve. The                     colonoscopy was performed without difficulty. The patient                     tolerated the procedure well. The quality of the bowel                     preparation was excellent. Findings:      A diminutive polyp was found at the hepatic flexure. The polyp was       sessile. The polyp was removed with a cold snare. Resection and       retrieval were complete.      Internal hemorrhoids were found during endoscopy. The hemorrhoids were       small and Grade I (internal hemorrhoids that do not prolapse).      The exam was otherwise without abnormality.      A few small-mouthed diverticula were found in the sigmoid colon. Impression:        - One diminutive polyp at the hepatic  flexure. Resected                     and retrieved.                    - Diverticulosis in the sigmoid colon.                    -  Internal hemorrhoids.                    - The examination was otherwise normal. Recommendation:    - Await pathology results. Manya Silvas, MD 01/29/2015 8:48:46 AM This report has been signed electronically. Number of Addenda: 0 Note Initiated On: 01/29/2015 7:51 AM Scope Withdrawal Time: 0 hours 8 minutes 38 seconds  Total Procedure Duration: 0 hours 17 minutes 59 seconds       [facility]

## 2015-01-29 NOTE — H&P (Signed)
Primary Care Physician:  Reginia Forts, MD Primary Gastroenterologist:  Dr. Vira Agar  Pre-Procedure History & Physical: HPI:  Margaret Massey is a 64 y.o. female is here for an colonoscopy.   Past Medical History  Diagnosis Date  . Chronic kidney disease   . Anxiety   . Cancer [facility])     Renal Cell Carcinoma s/p nephrectomy  . Hyperlipidemia   . Osteoporosis   . Hematuria, unspecified   . Hypertension     Past Surgical History  Procedure Laterality Date  . Right kidney removal      Renal Cell Carcinoma.  . Jaw surgery      Prior to Admission medications   Medication Sig Start Date End Date Taking? Authorizing Provider  atorvastatin (LIPITOR) 10 MG tablet Take 1 tablet (10 mg total) by mouth daily. 07/14/14   Wardell Honour, MD  Cholecalciferol (VITAMIN D-3) 1000 UNITS CAPS Take 1 capsule by mouth daily.    Historical Provider, MD  escitalopram (LEXAPRO) 10 MG tablet Take 1-2 tablets (10-20 mg total) by mouth daily. 07/14/14   Wardell Honour, MD  fish oil-omega-3 fatty acids 1000 MG capsule Take 2 g by mouth daily.    Historical Provider, MD  losartan (COZAAR) 50 MG tablet Take 1 tablet (50 mg total) by mouth daily. 01/19/15   Wardell Honour, MD  Multiple Vitamin (MULTIVITAMIN) tablet Take 1 tablet by mouth daily.    Historical Provider, MD  omeprazole (PRILOSEC) 40 MG capsule Take 1 capsule (40 mg total) by mouth daily. 07/14/14   Wardell Honour, MD  OVER THE COUNTER MEDICATION Calcium D3 750 mg taking daily    Historical Provider, MD    Allergies as of 09/02/2014 - Review Complete 07/14/2014  Allergen Reaction Noted  . Morphine and related Nausea And Vomiting 04/24/2012    Family History  Problem Relation Age of Onset  . Heart disease Father   . Heart disease Sister     pacemaker  . Diabetes Sister   . Hyperlipidemia Sister   . Hypertension Mother   . Depression Mother   . Osteoporosis Mother   . Heart disease Maternal Grandmother   . Heart disease Maternal Grandfather      Social History   Social History  . Marital Status: Married    Spouse Name: N/A  . Number of Children: 2  . Years of Education: college   Occupational History  . 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 Smoking status: Never Smoker   . Smokeless tobacco: Never Used  . Alcohol Use: 2.4 - 3.0 oz/week    4-5 Glasses of wine per week     Comment: 1 glass of wine per month on average.  . Drug Use: No  . Sexual Activity: Yes    Birth Control/ Protection: Post-menopausal   Other Topics Concern  . Not on file   Social History Narrative   Marital status: married x 44 years.      Children: 2 children: 4 grandchildren.      Lives: with husband, son, 2 grandchildren.      Employment:  Sales promotion account executive for [employer].      Tobacco: never      Alcohol: glass of wine 4-5 times a year.      Drugs: none     Education:College. Exercise:  None/Inactive    Review of Systems: See HPI, otherwise negative ROS  Physical Exam: BP 130/71 mmHg  Pulse 67  Temp(Src) 98 F (36.7 C) (Oral)  Resp 16  SpO2 100% General:   Alert,  pleasant and cooperative in NAD Head:  Normocephalic and atraumatic. Neck:  Supple; no masses or thyromegaly. Lungs:  Clear throughout to auscultation.    Heart:  Regular rate and rhythm. Abdomen:  Soft, nontender and nondistended. Normal bowel sounds, without guarding, and without rebound.   Neurologic:  Alert and  oriented x4;  grossly normal neurologically.  Impression/Plan: Margaret Massey is here for an colonoscopy to be performed for screening  Risks, benefits, limitations, and alternatives regarding  colonoscopy have been reviewed with the patient.  Questions have been answered.  All parties agreeable.   Gaylyn Cheers, MD  01/29/2015, 8:10 AM

## 2015-01-29 NOTE — Anesthesia Postprocedure Evaluation (Signed)
  Anesthesia Post-op Note  Patient: Margaret Massey  Procedure(s) Performed: Procedure(s): COLONOSCOPY WITH PROPOFOL (N/A)  Anesthesia type:General  Patient location: PACU  Post pain: Pain level controlled  Post assessment: Post-op Vital signs reviewed, Patient's Cardiovascular Status Stable, Respiratory Function Stable, Patent Airway and No signs of Nausea or vomiting  Post vital signs: Reviewed and stable  Last Vitals:  Filed Vitals:   01/29/15 0920  BP: 110/65  Pulse: 66  Temp:   Resp: 18    Level of consciousness: awake, alert  and patient cooperative  Complications: No apparent anesthesia complications

## 2015-01-29 NOTE — Transfer of Care (Signed)
Immediate Anesthesia Transfer of Care Note  Patient: Margaret Massey  Procedure(s) Performed: Procedure(s): COLONOSCOPY WITH PROPOFOL (N/A)  Patient Location: PACU  Anesthesia Type:General  Level of Consciousness: awake and oriented  Airway & Oxygen Therapy: Patient Spontanous Breathing and Patient connected to nasal cannula oxygen  Post-op Assessment: Report given to RN  Post vital signs: Reviewed and stable  Last Vitals:  Filed Vitals:   01/29/15 0737  BP: 130/71  Pulse: 67  Temp: 36.7 C  Resp: 16    Complications: No apparent anesthesia complications

## 2015-01-30 ENCOUNTER — Encounter: Payer: Self-pay | Admitting: Unknown Physician Specialty

## 2015-01-30 LAB — SURGICAL PATHOLOGY

## 2015-02-03 ENCOUNTER — Other Ambulatory Visit: Payer: Self-pay | Admitting: Family Medicine

## 2015-02-06 ENCOUNTER — Other Ambulatory Visit: Payer: Self-pay | Admitting: Family Medicine

## 2015-02-07 ENCOUNTER — Other Ambulatory Visit: Payer: Self-pay | Admitting: Family Medicine

## 2015-02-10 DIAGNOSIS — L821 Other seborrheic keratosis: Secondary | ICD-10-CM | POA: Diagnosis not present

## 2015-02-10 DIAGNOSIS — D1801 Hemangioma of skin and subcutaneous tissue: Secondary | ICD-10-CM | POA: Diagnosis not present

## 2015-05-11 ENCOUNTER — Other Ambulatory Visit: Payer: Self-pay | Admitting: Family Medicine

## 2015-07-20 ENCOUNTER — Ambulatory Visit: Payer: Medicare Other | Admitting: Family Medicine

## 2015-07-28 ENCOUNTER — Ambulatory Visit (INDEPENDENT_AMBULATORY_CARE_PROVIDER_SITE_OTHER): Payer: Medicare Other | Admitting: Family Medicine

## 2015-07-28 ENCOUNTER — Encounter: Payer: Self-pay | Admitting: Family Medicine

## 2015-07-28 VITALS — BP 138/80 | HR 64 | Temp 98.4°F | Resp 16 | Ht 64.0 in | Wt 153.6 lb

## 2015-07-28 DIAGNOSIS — I10 Essential (primary) hypertension: Secondary | ICD-10-CM

## 2015-07-28 DIAGNOSIS — E7800 Pure hypercholesterolemia, unspecified: Secondary | ICD-10-CM | POA: Diagnosis not present

## 2015-07-28 DIAGNOSIS — Z1231 Encounter for screening mammogram for malignant neoplasm of breast: Secondary | ICD-10-CM

## 2015-07-28 DIAGNOSIS — R7302 Impaired glucose tolerance (oral): Secondary | ICD-10-CM | POA: Diagnosis not present

## 2015-07-28 LAB — COMPREHENSIVE METABOLIC PANEL
ALK PHOS: 38 U/L (ref 33–130)
ALT: 12 U/L (ref 6–29)
AST: 14 U/L (ref 10–35)
Albumin: 4.1 g/dL (ref 3.6–5.1)
BUN: 22 mg/dL (ref 7–25)
CO2: 24 mmol/L (ref 20–31)
CREATININE: 1.04 mg/dL — AB (ref 0.50–0.99)
Calcium: 9.5 mg/dL (ref 8.6–10.4)
Chloride: 107 mmol/L (ref 98–110)
GLUCOSE: 96 mg/dL (ref 65–99)
POTASSIUM: 4.5 mmol/L (ref 3.5–5.3)
SODIUM: 140 mmol/L (ref 135–146)
TOTAL PROTEIN: 6.4 g/dL (ref 6.1–8.1)
Total Bilirubin: 0.5 mg/dL (ref 0.2–1.2)

## 2015-07-28 LAB — LIPID PANEL
Cholesterol: 169 mg/dL (ref 125–200)
HDL: 66 mg/dL (ref >=46)
LDL CALC: 87 mg/dL (ref <130)
TRIGLYCERIDES: 82 mg/dL (ref <150)
Total CHOL/HDL Ratio: 2.6 Ratio (ref <=5.0)
VLDL: 16 mg/dL (ref <30)

## 2015-07-28 LAB — HEMOGLOBIN A1C
HEMOGLOBIN A1C: 6 % — AB (ref <5.7)
Mean Plasma Glucose: 126 mg/dL

## 2015-07-28 MED ORDER — ESCITALOPRAM OXALATE 20 MG PO TABS: 20.0000 mg | ORAL_TABLET | Freq: Every day | ORAL | Status: DC

## 2015-07-28 NOTE — Progress Notes (Signed)
Subjective:    Patient ID: Margaret Massey, female    DOB: 02-20-1951, 65 y.o.   MRN: [id number]  07/28/2015  Hypertension   HPI This 65 y.o. female presents for six month follow-up:   1. HTN: Patient reports good compliance with medication, good tolerance to medication, and good symptom control.  Running 130s/80s-120/70s.    2.  Hypercholesterolemia: Patient reports good compliance with medication, good tolerance to medication, and good symptom control.    3.  Glucose intolerance: no exercise.    4.  Anxiety: Patient reports good compliance with medication, good tolerance to medication, and good symptom control.   Husband with prostate cancer diagnosed in 04/2015.  Two small places; Gleeson score was 9.   S/p bone scan.  Surgery 05/05/2015.  Lymph nodes negative.  Did really well; computer work; running.  [facility].  Recommend radiation.    5.  GERD: Patient reports good compliance with medication, good tolerance to medication, and good symptom control.    Ate at 8:30pm last night; up all night due to regurgitation.  Props up in bed; sleep 10:30pm; read some papers.  Reads a book.  Falls asleep reading.  Eight pillows in bed.    6.  Colon polyps:  +polyps.    Review of Systems  Constitutional: Negative for fever, chills, diaphoresis and fatigue.  Eyes: Negative for visual disturbance.  Respiratory: Negative for cough and shortness of breath.   Cardiovascular: Negative for chest pain, palpitations and leg swelling.  Gastrointestinal: Negative for nausea, vomiting, abdominal pain, diarrhea and constipation.  Endocrine: Negative for cold intolerance, heat intolerance, polydipsia, polyphagia and polyuria.  Neurological: Negative for dizziness, tremors, seizures, syncope, facial asymmetry, speech difficulty, weakness, light-headedness, numbness and headaches.    Past Medical History  Diagnosis Date  . Chronic kidney disease   . Anxiety   . Cancer [facility])    Renal Cell Carcinoma s/p nephrectomy  . Hyperlipidemia   . Osteoporosis   . Hematuria, unspecified   . Hypertension    Past Surgical History  Procedure Laterality Date  . Right kidney removal      Renal Cell Carcinoma.  . Jaw surgery    . Colonoscopy with propofol N/A 01/29/2015    Procedure: COLONOSCOPY WITH PROPOFOL;  Surgeon: Manya Silvas, MD;  Location: [facility] ENDOSCOPY;  Service: Endoscopy;  Laterality: N/A;   Allergies  Allergen Reactions  . Morphine And Related Nausea And Vomiting    Social History   Social History  . Marital Status: Married    Spouse Name: N/A  . Number of Children: 2  . Years of Education: college   Occupational History  . 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 Smoking status: Never Smoker   . Smokeless tobacco: Never Used  . Alcohol Use: 2.4 - 3.0 oz/week    4-5 Glasses of wine per week     Comment: 1 glass of wine per month on average.  . Drug Use: No  . Sexual Activity: Yes    Birth Control/ Protection: Post-menopausal   Other Topics Concern  . Not on file   Social History Narrative   Marital status: married x 44 years.      Children: 2 children: 4 grandchildren.      Lives: with husband, son, 2 grandchildren.      Employment:  Sales promotion account executive for [employer].      Tobacco: never  Alcohol: glass of wine 4-5 times a year.      Drugs: none     Education:College. Exercise:  None/Inactive   Family History  Problem Relation Age of Onset  . Heart disease Father   . Heart disease Sister     pacemaker  . Diabetes Sister   . Hyperlipidemia Sister   . Hypertension Mother   . Depression Mother   . Osteoporosis Mother   . Heart disease Maternal Grandmother   . Heart disease Maternal Grandfather        Objective:    BP 138/80 mmHg  Pulse 64  Temp(Src) 98.4 F (36.9 C) (Oral)  Resp 16  Ht 5\' 4"  (1.626 m)  Wt 153 lb 9.6 oz (69.673 kg)  BMI 26.35 kg/m2  SpO2 96% Physical Exam    Constitutional: She is oriented to person, place, and time. She appears well-developed and well-nourished. No distress.  HENT:  Head: Normocephalic and atraumatic.  Right Ear: External ear normal.  Left Ear: External ear normal.  Nose: Nose normal.  Mouth/Throat: Oropharynx is clear and moist.  Eyes: Conjunctivae and EOM are normal. Pupils are equal, round, and reactive to light.  Neck: Normal range of motion. Neck supple. Carotid bruit is not present. No thyromegaly present.  Cardiovascular: Normal rate, regular rhythm, normal heart sounds and intact distal pulses.  Exam reveals no gallop and no friction rub.   No murmur heard. Pulmonary/Chest: Effort normal and breath sounds normal. She has no wheezes. She has no rales.  Abdominal: Soft. Bowel sounds are normal. She exhibits no distension and no mass. There is no tenderness. There is no rebound and no guarding.  Lymphadenopathy:    She has no cervical adenopathy.  Neurological: She is alert and oriented to person, place, and time. No cranial nerve deficit.  Skin: Skin is warm and dry. No rash noted. She is not diaphoretic. No erythema. No pallor.  Psychiatric: She has a normal mood and affect. Her behavior is normal.        Assessment & Plan:   1. Pure hypercholesterolemia   2. Essential hypertension, benign   3. Glucose intolerance (impaired glucose tolerance)   4. Encounter for screening mammogram for breast cancer     Orders Placed This Encounter  Procedures  . MM SCREENING BREAST TOMO BILATERAL    Standing Status: Future     Number of Occurrences:      Standing Expiration Date: 09/26/2016    Order Specific Question:  Reason for Exam (SYMPTOM  OR DIAGNOSIS REQUIRED)    Answer:  annual screening    Order Specific Question:  Preferred imaging location?    Answer:  [facility]  . Comprehensive metabolic panel    Order Specific Question:  Has the patient fasted?    Answer:  Yes  . Hemoglobin A1c  . Lipid panel     Order Specific Question:  Has the patient fasted?    Answer:  Yes   Meds ordered this encounter  Medications  . escitalopram (LEXAPRO) 20 MG tablet    Sig: Take 1 tablet (20 mg total) by mouth daily.    Dispense:  90 tablet    Refill:  1    Return in about 6 months (around 01/28/2016) for complete physical examiniation.    Kimberl Vig Elayne Guerin, M.D. [facility] [address] [place], [place]  [address] [phone] phone [phone] fax

## 2015-07-28 NOTE — Patient Instructions (Signed)
Food Choices for Gastroesophageal Reflux Disease, Adult When you have gastroesophageal reflux disease (GERD), the foods you eat and your eating habits are very important. Choosing the right foods can help ease the discomfort of GERD. WHAT GENERAL GUIDELINES DO I NEED TO FOLLOW?  Choose fruits, vegetables, whole grains, low-fat dairy products, and low-fat meat, fish, and poultry.  Limit fats such as oils, salad dressings, butter, nuts, and avocado.  Keep a food diary to identify foods that cause symptoms.  Avoid foods that cause reflux. These may be different for different people.  Eat frequent small meals instead of three large meals each day.  Eat your meals slowly, in a relaxed setting.  Limit fried foods.  Cook foods using methods other than frying.  Avoid drinking alcohol.  Avoid drinking large amounts of liquids with your meals.  Avoid bending over or lying down until 2-3 hours after eating. WHAT FOODS ARE NOT RECOMMENDED? The following are some foods and drinks that may worsen your symptoms: Vegetables Tomatoes. Tomato juice. Tomato and spaghetti sauce. Chili peppers. Onion and garlic. Horseradish. Fruits Oranges, grapefruit, and lemon (fruit and juice). Meats High-fat meats, fish, and poultry. This includes hot dogs, ribs, ham, sausage, salami, and bacon. Dairy Whole milk and chocolate milk. Sour cream. Cream. Butter. Ice cream. Cream cheese.  Beverages Coffee and tea, with or without caffeine. Carbonated beverages or energy drinks. Condiments Hot sauce. Barbecue sauce.  Sweets/Desserts Chocolate and cocoa. Donuts. Peppermint and spearmint. Fats and Oils High-fat foods, including French fries and potato chips. Other Vinegar. Strong spices, such as black pepper, white pepper, red pepper, cayenne, curry powder, cloves, ginger, and chili powder. The items listed above may not be a complete list of foods and beverages to avoid. Contact your dietitian for more  information.   This information is not intended to replace advice given to you by your health care provider. Make sure you discuss any questions you have with your health care provider.   Document Released: 02/28/2005 Document Revised: 03/21/2014 Document Reviewed: 01/02/2013 Elsevier Interactive Patient Education 2016 Elsevier Inc.  

## 2015-08-06 ENCOUNTER — Other Ambulatory Visit: Payer: Self-pay | Admitting: Family Medicine

## 2015-10-19 ENCOUNTER — Ambulatory Visit
Admission: RE | Admit: 2015-10-19 | Discharge: 2015-10-19 | Disposition: A | Discharge status: Home / Self Care | Payer: Medicare Other | Source: Ambulatory Visit | Attending: Family Medicine | Admitting: Family Medicine

## 2015-10-19 DIAGNOSIS — Z1231 Encounter for screening mammogram for malignant neoplasm of breast: Secondary | ICD-10-CM | POA: Diagnosis not present

## 2015-11-07 ENCOUNTER — Other Ambulatory Visit: Payer: Self-pay | Admitting: Family Medicine

## 2015-11-25 DIAGNOSIS — Z6822 Body mass index (BMI) 22.0-22.9, adult: Secondary | ICD-10-CM | POA: Diagnosis not present

## 2015-11-25 DIAGNOSIS — R3129 Other microscopic hematuria: Secondary | ICD-10-CM | POA: Diagnosis not present

## 2015-11-25 DIAGNOSIS — Z85528 Personal history of other malignant neoplasm of kidney: Secondary | ICD-10-CM | POA: Diagnosis not present

## 2015-11-25 DIAGNOSIS — N2889 Other specified disorders of kidney and ureter: Secondary | ICD-10-CM | POA: Diagnosis not present

## 2015-11-25 DIAGNOSIS — N281 Cyst of kidney, acquired: Secondary | ICD-10-CM | POA: Diagnosis not present

## 2015-11-25 DIAGNOSIS — N3020 Other chronic cystitis without hematuria: Secondary | ICD-10-CM | POA: Diagnosis not present

## 2016-02-03 ENCOUNTER — Other Ambulatory Visit: Payer: Self-pay | Admitting: Family Medicine

## 2016-02-03 NOTE — Telephone Encounter (Signed)
Patient past due for office visit. More refills until seen.

## 2016-02-10 DIAGNOSIS — D485 Neoplasm of uncertain behavior of skin: Secondary | ICD-10-CM | POA: Diagnosis not present

## 2016-02-10 DIAGNOSIS — D2272 Melanocytic nevi of left lower limb, including hip: Secondary | ICD-10-CM | POA: Diagnosis not present

## 2016-02-10 DIAGNOSIS — D2261 Melanocytic nevi of right upper limb, including shoulder: Secondary | ICD-10-CM | POA: Diagnosis not present

## 2016-02-10 DIAGNOSIS — C44319 Basal cell carcinoma of skin of other parts of face: Secondary | ICD-10-CM | POA: Diagnosis not present

## 2016-02-10 DIAGNOSIS — D2371 Other benign neoplasm of skin of right lower limb, including hip: Secondary | ICD-10-CM | POA: Diagnosis not present

## 2016-02-10 DIAGNOSIS — D225 Melanocytic nevi of trunk: Secondary | ICD-10-CM | POA: Diagnosis not present

## 2016-02-13 DIAGNOSIS — Z23 Encounter for immunization: Secondary | ICD-10-CM | POA: Diagnosis not present

## 2016-03-12 ENCOUNTER — Other Ambulatory Visit: Payer: Self-pay | Admitting: Family Medicine

## 2016-03-16 ENCOUNTER — Ambulatory Visit (INDEPENDENT_AMBULATORY_CARE_PROVIDER_SITE_OTHER): Payer: Medicare Other | Admitting: Family Medicine

## 2016-03-16 ENCOUNTER — Encounter: Payer: Self-pay | Admitting: Family Medicine

## 2016-03-16 VITALS — BP 136/74 | HR 62 | Temp 98.6°F | Resp 18 | Ht 64.0 in | Wt 152.8 lb

## 2016-03-16 DIAGNOSIS — R339 Retention of urine, unspecified: Secondary | ICD-10-CM

## 2016-03-16 DIAGNOSIS — E2839 Other primary ovarian failure: Secondary | ICD-10-CM | POA: Diagnosis not present

## 2016-03-16 DIAGNOSIS — M8589 Other specified disorders of bone density and structure, multiple sites: Secondary | ICD-10-CM

## 2016-03-16 DIAGNOSIS — E7800 Pure hypercholesterolemia, unspecified: Secondary | ICD-10-CM

## 2016-03-16 DIAGNOSIS — N393 Stress incontinence (female) (male): Secondary | ICD-10-CM

## 2016-03-16 DIAGNOSIS — F329 Major depressive disorder, single episode, unspecified: Secondary | ICD-10-CM

## 2016-03-16 DIAGNOSIS — F418 Other specified anxiety disorders: Secondary | ICD-10-CM

## 2016-03-16 DIAGNOSIS — C641 Malignant neoplasm of right kidney, except renal pelvis: Secondary | ICD-10-CM | POA: Diagnosis not present

## 2016-03-16 DIAGNOSIS — R3129 Other microscopic hematuria: Secondary | ICD-10-CM | POA: Diagnosis not present

## 2016-03-16 DIAGNOSIS — I10 Essential (primary) hypertension: Secondary | ICD-10-CM

## 2016-03-16 DIAGNOSIS — Z0000 Encounter for general adult medical examination without abnormal findings: Secondary | ICD-10-CM

## 2016-03-16 DIAGNOSIS — R7302 Impaired glucose tolerance (oral): Secondary | ICD-10-CM | POA: Diagnosis not present

## 2016-03-16 DIAGNOSIS — F419 Anxiety disorder, unspecified: Secondary | ICD-10-CM

## 2016-03-16 DIAGNOSIS — N281 Cyst of kidney, acquired: Secondary | ICD-10-CM

## 2016-03-16 DIAGNOSIS — F32A Depression, unspecified: Secondary | ICD-10-CM

## 2016-03-16 LAB — POCT URINALYSIS DIP (MANUAL ENTRY)
BILIRUBIN UA: NEGATIVE
Bilirubin, UA: NEGATIVE
Glucose, UA: NEGATIVE
LEUKOCYTES UA: NEGATIVE
Nitrite, UA: NEGATIVE
PROTEIN UA: NEGATIVE
Spec Grav, UA: 1.015
Urobilinogen, UA: 0.2
pH, UA: 6

## 2016-03-16 MED ORDER — LOSARTAN POTASSIUM 50 MG PO TABS: 50.0000 mg | ORAL_TABLET | Freq: Every day | ORAL | 3 refills | Status: DC

## 2016-03-16 MED ORDER — OMEPRAZOLE 40 MG PO CPDR: 40.0000 mg | DELAYED_RELEASE_CAPSULE | Freq: Every day | ORAL | 3 refills | Status: DC

## 2016-03-16 MED ORDER — ESCITALOPRAM OXALATE 20 MG PO TABS: 20.0000 mg | ORAL_TABLET | Freq: Every day | ORAL | 3 refills | Status: DC

## 2016-03-16 MED ORDER — ATORVASTATIN CALCIUM 10 MG PO TABS: 10.0000 mg | ORAL_TABLET | Freq: Every day | ORAL | 3 refills | Status: DC

## 2016-03-16 MED ORDER — OMEPRAZOLE 20 MG PO CPDR: 20.0000 mg | DELAYED_RELEASE_CAPSULE | Freq: Every day | ORAL | 1 refills | Status: DC

## 2016-03-16 NOTE — Patient Instructions (Signed)
     IF you received an x-ray today, you will receive an invoice from [facility]. Please contact [facility] at [phone] with questions or concerns regarding your invoice.   IF you received labwork today, you will receive an invoice from [facility]. Please contact [facility] at [phone] with questions or concerns regarding your invoice.   Our billing staff will not be able to assist you with questions regarding bills from these companies.  You will be contacted with the lab results as soon as they are available. The fastest way to get your results is to activate your My Chart account. Instructions are located on the last page of this paperwork. If you have not heard from us regarding the results in 2 weeks, please contact this office.     

## 2016-03-16 NOTE — Progress Notes (Signed)
Subjective:    Patient ID: Margaret Massey, female    DOB: 05-01-1950, 66 y.o.   MRN: [id number]  03/16/2016  Annual Exam   HPI This 66 y.o. female presents for Annual Wellness Examination and follow-up of chronic medical conditions.  Last physical:  07-14-2014 Pap smear: 07-14-2014 Mammogram: 10-20-2015 Colonoscopy: 01-29-2015; single polyp. Bone density:  11-13-2013; Eye exam:  Scheduled soon; this week. Dental exam:  Regularly.  Immunization History  Administered Date(s) Administered  . Influenza Split 12/09/2010  . Influenza,inj,Quad PF,36+ Mos 01/19/2015  . Influenza-Unspecified 12/12/2012, 12/12/2013, 12/13/2015  . Pneumococcal Conjugate-13 01/22/2014  . Pneumococcal-Unspecified 12/12/2013  . Tdap 11/10/2010  . Zoster 12/12/2013   BP Readings from Last 3 Encounters:  03/16/16 136/74  07/28/15 138/80  01/29/15 110/65   Wt Readings from Last 3 Encounters:  03/16/16 152 lb 12.8 oz (69.3 kg)  07/28/15 153 lb 9.6 oz (69.7 kg)  01/19/15 153 lb 3.2 oz (69.5 kg)    HTN: Patient reports good compliance with medication, good tolerance to medication, and good symptom control.  140/  Glucose Intolerance: sister with diabetes; sister 85 years older.  Bladder cancer; s/p follow-up with [facility]; he really emphasized weight loss.    Hypercholesterolemia: Patient reports good compliance with medication, good tolerance to medication, and good symptom control.    Anxiety; Patient reports good compliance with medication, good tolerance to medication, and good symptom control.  Review of Systems  Constitutional: Negative for activity change, appetite change, chills, diaphoresis, fatigue, fever and unexpected weight change.  HENT: Negative for congestion, dental problem, drooling, ear discharge, ear pain, facial swelling, hearing loss, mouth sores, nosebleeds, postnasal drip, rhinorrhea, sinus pressure, sneezing, sore throat, tinnitus, trouble swallowing and voice change.   Eyes: Negative  for photophobia, pain, discharge, redness, itching and visual disturbance.  Respiratory: Negative for apnea, cough, choking, chest tightness, shortness of breath, wheezing and stridor.   Cardiovascular: Negative for chest pain, palpitations and leg swelling.  Gastrointestinal: Negative for abdominal distention, abdominal pain, anal bleeding, blood in stool, constipation, diarrhea, nausea, rectal pain and vomiting.  Endocrine: Negative for cold intolerance, heat intolerance, polydipsia, polyphagia and polyuria.  Genitourinary: Negative for decreased urine volume, difficulty urinating, dyspareunia, dysuria, enuresis, flank pain, frequency, genital sores, hematuria, menstrual problem, pelvic pain, urgency, vaginal bleeding, vaginal discharge and vaginal pain.  Musculoskeletal: Negative for arthralgias, back pain, gait problem, joint swelling, myalgias, neck pain and neck stiffness.  Skin: Negative for color change, pallor, rash and wound.  Allergic/Immunologic: Negative for environmental allergies, food allergies and immunocompromised state.  Neurological: Negative for dizziness, tremors, seizures, syncope, facial asymmetry, speech difficulty, weakness, light-headedness, numbness and headaches.  Hematological: Negative for adenopathy. Does not bruise/bleed easily.  Psychiatric/Behavioral: Negative for agitation, behavioral problems, confusion, decreased concentration, dysphoric mood, hallucinations, self-injury, sleep disturbance and suicidal ideas. The patient is not nervous/anxious and is not hyperactive.     Past Medical History:  Diagnosis Date  . Anxiety   . Cancer [facility])    Renal Cell Carcinoma s/p nephrectomy  . Chronic kidney disease   . Hematuria, unspecified   . Hyperlipidemia   . Hypertension   . Osteoporosis    Past Surgical History:  Procedure Laterality Date  . COLONOSCOPY WITH PROPOFOL N/A 01/29/2015   Procedure: COLONOSCOPY WITH PROPOFOL;  Surgeon: Manya Silvas, MD;   Location: [facility] ENDOSCOPY;  Service: Endoscopy;  Laterality: N/A;  . jaw surgery    . right kidney removal     Renal Cell Carcinoma.   Allergies  Allergen  Reactions  . Morphine And Related Nausea And Vomiting    Social History   Social History  . Marital status: Married    Spouse name: N/A  . Number of children: 2  . Years of education: college   Occupational History  . 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 Smoking status: Never Smoker  . Smokeless tobacco: Never Used  . Alcohol use 2.4 - 3.0 oz/week    4 - 5 Glasses of wine per week     Comment: 1 glass of wine per month on average.  . Drug use: No  . Sexual activity: Yes    Birth control/ protection: Post-menopausal   Other Topics Concern  . Not on file   Social History Narrative   Marital status: married x 44 years.      Children: 2 children: 4 grandchildren.      Lives: with husband, son, 2 grandchildren.      Employment:  Sales promotion account executive for [employer].      Tobacco: never      Alcohol: glass of wine 4-5 times a year.      Drugs: none     Education:College. Exercise:  None/Inactive   Family History  Problem Relation Age of Onset  . Heart disease Father   . Heart disease Sister     pacemaker  . Diabetes Sister   . Hyperlipidemia Sister   . Hypertension Mother   . Depression Mother   . Osteoporosis Mother   . Heart disease Maternal Grandmother   . Heart disease Maternal Grandfather   . Breast cancer Maternal Aunt        Objective:    BP 136/74 (BP Location: Right Arm, Patient Position: Sitting, Cuff Size: Normal)   Pulse 62   Temp 98.6 F (37 C) (Oral)   Resp 18   Ht 5\' 4"  (1.626 m)   Wt 152 lb 12.8 oz (69.3 kg)   SpO2 95%   BMI 26.23 kg/m  Physical Exam  Constitutional: She is oriented to person, place, and time. She appears well-developed and well-nourished. No distress.  HENT:  Head: Normocephalic and atraumatic.  Right Ear: External ear normal.    Left Ear: External ear normal.  Nose: Nose normal.  Mouth/Throat: Oropharynx is clear and moist.  Eyes: Conjunctivae and EOM are normal. Pupils are equal, round, and reactive to light.  Neck: Normal range of motion and full passive range of motion without pain. Neck supple. No JVD present. Carotid bruit is not present. No thyromegaly present.  Cardiovascular: Normal rate, regular rhythm and normal heart sounds.  Exam reveals no gallop and no friction rub.   No murmur heard. Pulmonary/Chest: Effort normal and breath sounds normal. She has no wheezes. She has no rales. Right breast exhibits no inverted nipple, no mass, no nipple discharge, no skin change and no tenderness. Left breast exhibits no inverted nipple, no mass, no nipple discharge, no skin change and no tenderness. Breasts are symmetrical.  Abdominal: Soft. Bowel sounds are normal. She exhibits no distension and no mass. There is no tenderness. There is no rebound and no guarding.  Musculoskeletal:       Right shoulder: Normal.       Left shoulder: Normal.       Cervical back: Normal.  Lymphadenopathy:    She has no cervical adenopathy.  Neurological: She is alert and oriented to person, place, and time. She has normal reflexes. No  cranial nerve deficit. She exhibits normal muscle tone. Coordination normal.  Skin: Skin is warm and dry. No rash noted. She is not diaphoretic. No erythema. No pallor.  Psychiatric: She has a normal mood and affect. Her behavior is normal. Judgment and thought content normal.  Nursing note and vitals reviewed.  Results for orders placed or performed in visit on 03/16/16  POCT urinalysis dipstick  Result Value Ref Range   Color, UA yellow yellow   Clarity, UA clear clear   Glucose, UA negative negative   Bilirubin, UA negative negative   Ketones, POC UA negative negative   Spec Grav, UA 1.015    Blood, UA small (A) negative   pH, UA 6.0    Protein Ur, POC negative negative   Urobilinogen, UA 0.2     Nitrite, UA Negative Negative   Leukocytes, UA Negative Negative   Depression screen [facility] 2/9 03/16/2016 07/28/2015 01/19/2015 07/14/2014 01/22/2014  Decreased Interest 0 0 0 0 0  Down, Depressed, Hopeless 0 0 0 0 0  PHQ - 2 Score 0 0 0 0 0   Fall Risk  03/16/2016 07/28/2015 01/19/2015 07/14/2014 01/22/2014  Falls in the past year? No No No No No   Functional Status Survey: Is the patient deaf or have difficulty hearing?: No Does the patient have difficulty seeing, even when wearing glasses/contacts?: No Does the patient have difficulty concentrating, remembering, or making decisions?: No Does the patient have difficulty walking or climbing stairs?: No Does the patient have difficulty dressing or bathing?: No Does the patient have difficulty doing errands alone such as visiting a doctor's office or shopping?: No     Assessment & Plan:   1. Encounter for Medicare annual wellness exam   2. Glucose intolerance (impaired glucose tolerance)   3. Osteopenia of multiple sites   4. Renal cell carcinoma of right kidney ([place])   5. Hematuria, microscopic   6. Parapelvic renal cyst   7. Pure hypercholesterolemia   8. Anxiety and depression   9. Female genuine stress incontinence   10. Incomplete bladder emptying   11. Essential hypertension   12. Estrogen deficiency     Orders Placed This Encounter  Procedures  . DG Bone Density    Standing Status:   Future    Standing Expiration Date:   05/14/2017    Order Specific Question:   Reason for Exam (SYMPTOM  OR DIAGNOSIS REQUIRED)    Answer:   estrogen deficiency    Order Specific Question:   Preferred imaging location?    Answer:   [facility]  . CBC with Differential/Platelet  . Comprehensive metabolic panel    Order Specific Question:   Has the patient fasted?    Answer:   Yes  . Lipid panel    Order Specific Question:   Has the patient fasted?    Answer:   Yes  . Hemoglobin A1c  . POCT urinalysis dipstick  . EKG 12-Lead   Meds  ordered this encounter  Medications  . atorvastatin (LIPITOR) 10 MG tablet    Sig: Take 1 tablet (10 mg total) by mouth daily.    Dispense:  90 tablet    Refill:  3  . escitalopram (LEXAPRO) 20 MG tablet    Sig: Take 1 tablet (20 mg total) by mouth daily.    Dispense:  90 tablet    Refill:  3    Patient past due for office visit. More refills until seen.  Marland Kitchen losartan (COZAAR) 50 MG  tablet    Sig: Take 1 tablet (50 mg total) by mouth daily.    Dispense:  90 tablet    Refill:  3  . DISCONTD: omeprazole (PRILOSEC) 40 MG capsule    Sig: Take 1 capsule (40 mg total) by mouth daily.    Dispense:  90 capsule    Refill:  3    REQUEST FURTHER REFILLS AT VISIT 03/16/2016  . omeprazole (PRILOSEC) 20 MG capsule    Sig: Take 1 capsule (20 mg total) by mouth daily.    Dispense:  90 capsule    Refill:  1    Return in about 6 months (around 09/13/2016) for recheck high blood pressure, high cholesterol.   Kristi Elayne Guerin, M.D. [facility] [address] [place], [place]  [phone] phone [phone] fax

## 2016-03-17 LAB — HEMOGLOBIN A1C
ESTIMATED AVERAGE GLUCOSE: 117 mg/dL
Hgb A1c MFr Bld: 5.7 % — ABNORMAL HIGH (ref 4.8–5.6)

## 2016-03-17 LAB — CBC WITH DIFFERENTIAL/PLATELET
BASOS ABS: 0 10*3/uL (ref 0.0–0.2)
Basos: 1 %
EOS (ABSOLUTE): 0.1 10*3/uL (ref 0.0–0.4)
Eos: 2 %
HEMOGLOBIN: 13.4 g/dL (ref 11.1–15.9)
Hematocrit: 40.2 % (ref 34.0–46.6)
Immature Grans (Abs): 0 10*3/uL (ref 0.0–0.1)
Immature Granulocytes: 0 %
LYMPHS ABS: 1.5 10*3/uL (ref 0.7–3.1)
Lymphs: 21 %
MCH: 28.9 pg (ref 26.6–33.0)
MCHC: 33.3 g/dL (ref 31.5–35.7)
MCV: 87 fL (ref 79–97)
MONOCYTES: 6 %
MONOS ABS: 0.4 10*3/uL (ref 0.1–0.9)
NEUTROS ABS: 4.8 10*3/uL (ref 1.4–7.0)
Neutrophils: 70 %
Platelets: 286 10*3/uL (ref 150–379)
RBC: 4.63 x10E6/uL (ref 3.77–5.28)
RDW: 13.1 % (ref 12.3–15.4)
WBC: 6.9 10*3/uL (ref 3.4–10.8)

## 2016-03-17 LAB — COMPREHENSIVE METABOLIC PANEL
ALBUMIN: 4.3 g/dL (ref 3.6–4.8)
ALK PHOS: 43 IU/L (ref 39–117)
ALT: 19 IU/L (ref 0–32)
AST: 20 IU/L (ref 0–40)
Albumin/Globulin Ratio: 2.2 (ref 1.2–2.2)
BILIRUBIN TOTAL: 0.6 mg/dL (ref 0.0–1.2)
BUN / CREAT RATIO: 21 (ref 12–28)
BUN: 26 mg/dL (ref 8–27)
CHLORIDE: 103 mmol/L (ref 96–106)
CO2: 23 mmol/L (ref 18–29)
Calcium: 9.6 mg/dL (ref 8.7–10.3)
Creatinine, Ser: 1.21 mg/dL — ABNORMAL HIGH (ref 0.57–1.00)
GFR calc Af Amer: 53 mL/min/{1.73_m2} — ABNORMAL LOW (ref >59)
GFR calc non Af Amer: 46 mL/min/{1.73_m2} — ABNORMAL LOW (ref >59)
GLOBULIN, TOTAL: 2 g/dL (ref 1.5–4.5)
GLUCOSE: 94 mg/dL (ref 65–99)
Potassium: 4.9 mmol/L (ref 3.5–5.2)
SODIUM: 142 mmol/L (ref 134–144)
Total Protein: 6.3 g/dL (ref 6.0–8.5)

## 2016-03-17 LAB — LIPID PANEL
CHOLESTEROL TOTAL: 181 mg/dL (ref 100–199)
Chol/HDL Ratio: 2.5 ratio units (ref 0.0–4.4)
HDL: 72 mg/dL (ref >39)
LDL Calculated: 90 mg/dL (ref 0–99)
Triglycerides: 96 mg/dL (ref 0–149)
VLDL Cholesterol Cal: 19 mg/dL (ref 5–40)

## 2016-03-22 DIAGNOSIS — H2513 Age-related nuclear cataract, bilateral: Secondary | ICD-10-CM | POA: Diagnosis not present

## 2016-04-18 DIAGNOSIS — L905 Scar conditions and fibrosis of skin: Secondary | ICD-10-CM | POA: Diagnosis not present

## 2016-04-18 DIAGNOSIS — C44319 Basal cell carcinoma of skin of other parts of face: Secondary | ICD-10-CM | POA: Diagnosis not present

## 2016-07-19 DIAGNOSIS — S70362A Insect bite (nonvenomous), left thigh, initial encounter: Secondary | ICD-10-CM | POA: Diagnosis not present

## 2016-07-19 DIAGNOSIS — D2261 Melanocytic nevi of right upper limb, including shoulder: Secondary | ICD-10-CM | POA: Diagnosis not present

## 2016-07-19 DIAGNOSIS — D2272 Melanocytic nevi of left lower limb, including hip: Secondary | ICD-10-CM | POA: Diagnosis not present

## 2016-07-19 DIAGNOSIS — D225 Melanocytic nevi of trunk: Secondary | ICD-10-CM | POA: Diagnosis not present

## 2016-08-01 ENCOUNTER — Other Ambulatory Visit: Payer: Self-pay | Admitting: Family Medicine

## 2016-09-21 ENCOUNTER — Ambulatory Visit (INDEPENDENT_AMBULATORY_CARE_PROVIDER_SITE_OTHER): Payer: Medicare Other | Admitting: Family Medicine

## 2016-09-21 ENCOUNTER — Encounter: Payer: Self-pay | Admitting: Family Medicine

## 2016-09-21 VITALS — BP 115/75 | HR 75 | Temp 98.0°F | Resp 18 | Ht 63.58 in | Wt 155.8 lb

## 2016-09-21 DIAGNOSIS — F329 Major depressive disorder, single episode, unspecified: Secondary | ICD-10-CM | POA: Diagnosis not present

## 2016-09-21 DIAGNOSIS — R7302 Impaired glucose tolerance (oral): Secondary | ICD-10-CM

## 2016-09-21 DIAGNOSIS — I10 Essential (primary) hypertension: Secondary | ICD-10-CM | POA: Diagnosis not present

## 2016-09-21 DIAGNOSIS — F419 Anxiety disorder, unspecified: Secondary | ICD-10-CM | POA: Diagnosis not present

## 2016-09-21 DIAGNOSIS — F32A Depression, unspecified: Secondary | ICD-10-CM

## 2016-09-21 DIAGNOSIS — E7800 Pure hypercholesterolemia, unspecified: Secondary | ICD-10-CM

## 2016-09-21 DIAGNOSIS — Z23 Encounter for immunization: Secondary | ICD-10-CM

## 2016-09-21 DIAGNOSIS — Z85528 Personal history of other malignant neoplasm of kidney: Secondary | ICD-10-CM

## 2016-09-21 DIAGNOSIS — M8589 Other specified disorders of bone density and structure, multiple sites: Secondary | ICD-10-CM

## 2016-09-21 MED ORDER — ZOSTER VAC RECOMB ADJUVANTED 50 MCG/0.5ML IM SUSR: 0.5000 mL | Freq: Once | INTRAMUSCULAR | 1 refills | Status: AC

## 2016-09-21 NOTE — Progress Notes (Signed)
Subjective:    Patient ID: Margaret Massey, female    DOB: [birth year], 66 y.o.   MRN: [id number]  09/21/2016  Hypertension (6 month follow-up); Hyperlipidemia; and Depression   HPI This 66 y.o. female presents for six month follow-up of hypertension, hypercholesterolemia, glucose intolerance. No changes to management made at last visit.  Bagel, cream cheese, and coffee.  Home BP running 120s-130s/70-80s.  Loves bread.  [place].  Not exercising.  Husband exercises every day.  Needs to exercise.  Lives in [place]; son living in [place]; moving to [place].  Daughter in Production designer, theatre/television/film at prison.  Two granddaughters; [place] tennis this summer.    Flew into [place]; drove four hours to the [place].    BP Readings from Last 3 Encounters:  09/21/16 115/75  03/16/16 136/74  07/28/15 138/80   Wt Readings from Last 3 Encounters:  09/21/16 155 lb 12.8 oz (70.7 kg)  03/16/16 152 lb 12.8 oz (69.3 kg)  07/28/15 153 lb 9.6 oz (69.7 kg)   Immunization History  Administered Date(s) Administered  . Influenza Split 12/09/2010  . Influenza,inj,Quad PF,36+ Mos 01/19/2015  . Influenza-Unspecified 12/12/2012, 12/12/2013, 12/13/2015  . Pneumococcal Conjugate-13 01/22/2014  . Pneumococcal-Unspecified 12/12/2013  . Tdap 11/10/2010  . Zoster 12/12/2013     Review of Systems  Constitutional: Negative for chills, diaphoresis, fatigue and fever.  Eyes: Negative for visual disturbance.  Respiratory: Negative for cough and shortness of breath.   Cardiovascular: Negative for chest pain, palpitations and leg swelling.  Gastrointestinal: Negative for abdominal pain, constipation, diarrhea, nausea and vomiting.  Endocrine: Negative for cold intolerance, heat intolerance, polydipsia, polyphagia and polyuria.  Neurological: Negative for dizziness, tremors, seizures, syncope, facial asymmetry, speech difficulty, weakness, light-headedness, numbness and headaches.    Past Medical History:  Diagnosis Date  .  Anxiety   . Cancer [facility])    Renal Cell Carcinoma s/p nephrectomy  . Chronic kidney disease   . Hematuria, unspecified   . Hyperlipidemia   . Hypertension   . Osteoporosis    Past Surgical History:  Procedure Laterality Date  . COLONOSCOPY WITH PROPOFOL N/A 01/29/2015   Procedure: COLONOSCOPY WITH PROPOFOL;  Surgeon: Manya Silvas, MD;  Location[facility] ENDOSCOPY;  Service: Endoscopy;  Laterality: N/A;  . jaw surgery    . right kidney removal     Renal Cell Carcinoma.   Allergies  Allergen Reactions  . Morphine And Related Nausea And Vomiting    Social History   Social History  . Marital status: Married    Spouse name: N/A  . Number of children: 2  . Years of education: college   Occupational History  . 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 Smoking status: Never Smoker  . Smokeless tobacco: Never Used  . Alcohol use 2.4 - 3.0 oz/week    4 - 5 Glasses of wine per week     Comment: 1 glass of wine per month on average.  . Drug use: No  . Sexual activity: Yes    Birth control/ protection: Post-menopausal   Other Topics Concern  . Not on file   Social History Narrative   Marital status: married x 44 years.      Children: 2 children: 4 grandchildren.      Lives: with husband, son, 2 grandchildren.      Employment:  Sales promotion account executive for [employer].      Tobacco: never      Alcohol: glass of  wine 4-5 times a year.      Drugs: none     Education:College. Exercise:  None/Inactive   Family History  Problem Relation Age of Onset  . Heart disease Father   . Heart disease Sister        pacemaker  . Diabetes Sister   . Hyperlipidemia Sister   . Hypertension Mother   . Depression Mother   . Osteoporosis Mother   . Heart disease Maternal Grandmother   . Heart disease Maternal Grandfather   . Breast cancer Maternal Aunt        Objective:    BP 115/75   Pulse 75   Temp 98 F (36.7 C) (Oral)   Resp 18   Ht 5' 3.58"  (1.615 m)   Wt 155 lb 12.8 oz (70.7 kg)   SpO2 96%   BMI 27.09 kg/m  Physical Exam  Constitutional: She is oriented to person, place, and time. She appears well-developed and well-nourished. No distress.  HENT:  Head: Normocephalic and atraumatic.  Right Ear: External ear normal.  Left Ear: External ear normal.  Nose: Nose normal.  Mouth/Throat: Oropharynx is clear and moist.  Eyes: Conjunctivae and EOM are normal. Pupils are equal, round, and reactive to light.  Neck: Normal range of motion. Neck supple. Carotid bruit is not present. No thyromegaly present.  Cardiovascular: Normal rate, regular rhythm, normal heart sounds and intact distal pulses.  Exam reveals no gallop and no friction rub.   No murmur heard. Pulmonary/Chest: Effort normal and breath sounds normal. She has no wheezes. She has no rales.  Abdominal: Soft. Bowel sounds are normal. She exhibits no distension and no mass. There is no tenderness. There is no rebound and no guarding.  Lymphadenopathy:    She has no cervical adenopathy.  Neurological: She is alert and oriented to person, place, and time. No cranial nerve deficit.  Skin: Skin is warm and dry. No rash noted. She is not diaphoretic. No erythema. No pallor.  Psychiatric: She has a normal mood and affect. Her behavior is normal.    Depression screen [facility] 2/9 09/21/2016 03/16/2016 07/28/2015 01/19/2015 07/14/2014  Decreased Interest 0 0 0 0 0  Down, Depressed, Hopeless 0 0 0 0 0  PHQ - 2 Score 0 0 0 0 0   Fall Risk  09/21/2016 03/16/2016 07/28/2015 01/19/2015 07/14/2014  Falls in the past year? No No No No No       Assessment & Plan:   1. Glucose intolerance (impaired glucose tolerance)   2. Anxiety and depression   3. Pure hypercholesterolemia   4. Essential hypertension   5. Osteopenia of multiple sites   6. History of renal cell carcinoma   7. Need for shingles vaccine    -controlled; obtain labs; continue current medications. - I recommend weight loss,  exercise, and low-carbohydrate low-sugar food choices. You should AVOID: regular sodas, sweetened tea, fruit juices.  You should LIMIT: breads, pastas, rice, potatoes, and desserts/sweets.  I would recommend limiting your total carbohydrate intake per meal to 45 grams; I would limit your total carbohydrate intake per snack to 30 grams.  I would also have a goal of 60 grams of protein intake per day; this would equal 10-15 grams of protein per meal and 5-10 grams of protein per snack. -recommend weight loss, exercise for 30-60 minutes five days per week; recommend 1200 kcal restriction per day with a minimum of 60 grams of protein per day.    Orders Placed This Encounter  Procedures  . CBC with Differential/Platelet  . Comprehensive metabolic panel    Order Specific Question:   Has the patient fasted?    Answer:   Yes  . Lipid panel    Order Specific Question:   Has the patient fasted?    Answer:   Yes  . Hemoglobin A1c   Meds ordered this encounter  Medications  . Zoster Vac Recomb Adjuvanted [facility]) injection    Sig: Inject 0.5 mLs into the muscle once.    Dispense:  0.5 mL    Refill:  1    Return in about 6 months (around 03/24/2017) for complete physical examiniation.   Kyani Simkin Elayne Guerin, M.D. [facility] at [facility] previously [facility] [address] [place],   [phone] phone [phone] fax

## 2016-09-21 NOTE — Patient Instructions (Addendum)
I recommend weight loss, exercise, and low-carbohydrate low-sugar food choices.   You should AVOID: regular sodas, sweetened tea, fruit juices.  You should LIMIT: breads, pastas, rice, potatoes, and desserts/sweets.  I would recommend limiting your total carbohydrate intake per meal to 45 grams; I would limit your total carbohydrate intake per snack to 30 grams.    I would also have a goal of 60 grams of protein intake per day; this would equal 10-15 grams of protein per meal and 5-10 grams of protein per snack.   IF you received an x-ray today, you will receive an invoice from [facility]. Please contact [facility] at [phone] with questions or concerns regarding your invoice.   IF you received labwork today, you will receive an invoice from [facility]. Please contact [facility] at [phone] with questions or concerns regarding your invoice.   Our billing staff will not be able to assist you with questions regarding bills from these companies.  You will be contacted with the lab results as soon as they are available. The fastest way to get your results is to activate your My Chart account. Instructions are located on the last page of this paperwork. If you have not heard from [place] regarding the results in 2 weeks, please contact this office.      Fat and Cholesterol Restricted Diet Getting too much fat and cholesterol in your diet may cause health problems. Following this diet helps keep your fat and cholesterol at normal levels. This can keep you from getting sick. What types of fat should I choose?  Choose monosaturated and polyunsaturated fats. These are found in foods such as olive oil, canola oil, flaxseeds, walnuts, almonds, and seeds.  Eat more omega-3 fats. Good choices include salmon, mackerel, sardines, tuna, flaxseed oil, and ground flaxseeds.  Limit saturated fats. These are in animal products such as meats, butter, and cream. They can also be in plant  products such as palm oil, palm kernel oil, and coconut oil.  Avoid foods with partially hydrogenated oils in them. These contain trans fats. Examples of foods that have trans fats are stick margarine, some tub margarines, cookies, crackers, and other baked goods. What general guidelines do I need to follow?  Check food labels. Look for the words "trans fat" and "saturated fat."  When preparing a meal: ? Fill half of your plate with vegetables and green salads. ? Fill one fourth of your plate with whole grains. Look for the word "whole" as the first word in the ingredient list. ? Fill one fourth of your plate with lean protein foods.  Eat more foods that have fiber, like apples, carrots, beans, peas, and barley.  Eat more home-cooked foods. Eat less at restaurants and buffets.  Limit or avoid alcohol.  Limit foods high in starch and sugar.  Limit fried foods.  Cook foods without frying them. Baking, boiling, grilling, and broiling are all great options.  Lose weight if you are overweight. Losing even a small amount of weight can help your overall health. It can also help prevent diseases such as diabetes and heart disease. What foods can I eat? Grains Whole grains, such as whole wheat or whole grain breads, crackers, cereals, and pasta. Unsweetened oatmeal, bulgur, barley, quinoa, or brown rice. Corn or whole wheat flour tortillas. Vegetables Fresh or frozen vegetables (raw, steamed, roasted, or grilled). Green salads. Fruits All fresh, canned (in natural juice), or frozen fruits. Meat and Other Protein Products Ground beef (85% or leaner),  grass-fed beef, or beef trimmed of fat. Skinless chicken or [place]. Ground chicken or [place]. Pork trimmed of fat. All fish and seafood. Eggs. Dried beans, peas, or lentils. Unsalted nuts or seeds. Unsalted canned or dry beans. Dairy Low-fat dairy products, such as skim or 1% milk, 2% or reduced-fat cheeses, low-fat ricotta or cottage cheese, or  plain low-fat yogurt. Fats and Oils Tub margarines without trans fats. Light or reduced-fat mayonnaise and salad dressings. Avocado. Olive, canola, sesame, or safflower oils. Natural peanut or almond butter (choose ones without added sugar and oil). The items listed above may not be a complete list of recommended foods or beverages. Contact your dietitian for more options. What foods are not recommended? Grains White bread. White pasta. White rice. Cornbread. Bagels, pastries, and croissants. Crackers that contain trans fat. Vegetables White potatoes. Corn. Creamed or fried vegetables. Vegetables in a cheese sauce. Fruits Dried fruits. Canned fruit in light or heavy syrup. Fruit juice. Meat and Other Protein Products Fatty cuts of meat. Ribs, chicken wings, bacon, sausage, bologna, salami, chitterlings, fatback, hot dogs, bratwurst, and packaged luncheon meats. Liver and organ meats. Dairy Whole or 2% milk, cream, half-and-half, and cream cheese. Whole milk cheeses. Whole-fat or sweetened yogurt. Full-fat cheeses. Nondairy creamers and whipped toppings. Processed cheese, cheese spreads, or cheese curds. Sweets and Desserts Corn syrup, sugars, honey, and molasses. Candy. Jam and jelly. Syrup. Sweetened cereals. Cookies, pies, cakes, donuts, muffins, and ice cream. Fats and Oils Butter, stick margarine, lard, shortening, ghee, or bacon fat. Coconut, palm kernel, or palm oils. Beverages Alcohol. Sweetened drinks (such as sodas, lemonade, and fruit drinks or punches). The items listed above may not be a complete list of foods and beverages to avoid. Contact your dietitian for more information. This information is not intended to replace advice given to you by your health care provider. Make sure you discuss any questions you have with your health care provider. Document Released: 08/30/2011 Document Revised: 11/05/2015 Document Reviewed: 05/30/2013 Elsevier Interactive Patient Education  [employer].

## 2016-09-22 LAB — LIPID PANEL
CHOL/HDL RATIO: 2.6 ratio (ref 0.0–4.4)
Cholesterol, Total: 165 mg/dL (ref 100–199)
HDL: 64 mg/dL (ref >39)
LDL CALC: 81 mg/dL (ref 0–99)
TRIGLYCERIDES: 100 mg/dL (ref 0–149)
VLDL Cholesterol Cal: 20 mg/dL (ref 5–40)

## 2016-09-22 LAB — CBC WITH DIFFERENTIAL/PLATELET
Basophils Absolute: 0 10*3/uL (ref 0.0–0.2)
Basos: 1 %
EOS (ABSOLUTE): 0.2 10*3/uL (ref 0.0–0.4)
EOS: 3 %
HEMATOCRIT: 41 % (ref 34.0–46.6)
HEMOGLOBIN: 13.4 g/dL (ref 11.1–15.9)
IMMATURE GRANS (ABS): 0 10*3/uL (ref 0.0–0.1)
IMMATURE GRANULOCYTES: 0 %
LYMPHS: 27 %
Lymphocytes Absolute: 1.8 10*3/uL (ref 0.7–3.1)
MCH: 29.1 pg (ref 26.6–33.0)
MCHC: 32.7 g/dL (ref 31.5–35.7)
MCV: 89 fL (ref 79–97)
MONOCYTES: 4 %
MONOS ABS: 0.3 10*3/uL (ref 0.1–0.9)
NEUTROS PCT: 65 %
Neutrophils Absolute: 4.4 10*3/uL (ref 1.4–7.0)
Platelets: 264 10*3/uL (ref 150–379)
RBC: 4.61 x10E6/uL (ref 3.77–5.28)
RDW: 13.1 % (ref 12.3–15.4)
WBC: 6.7 10*3/uL (ref 3.4–10.8)

## 2016-09-22 LAB — COMPREHENSIVE METABOLIC PANEL
ALT: 14 IU/L (ref 0–32)
AST: 18 IU/L (ref 0–40)
Albumin/Globulin Ratio: 2.3 — ABNORMAL HIGH (ref 1.2–2.2)
Albumin: 4.6 g/dL (ref 3.6–4.8)
Alkaline Phosphatase: 44 IU/L (ref 39–117)
BUN/Creatinine Ratio: 22 (ref 12–28)
BUN: 30 mg/dL — ABNORMAL HIGH (ref 8–27)
Bilirubin Total: 0.4 mg/dL (ref 0.0–1.2)
CALCIUM: 9.9 mg/dL (ref 8.7–10.3)
CO2: 22 mmol/L (ref 20–29)
CREATININE: 1.34 mg/dL — AB (ref 0.57–1.00)
Chloride: 106 mmol/L (ref 96–106)
GFR, EST AFRICAN AMERICAN: 47 mL/min/{1.73_m2} — AB (ref >59)
GFR, EST NON AFRICAN AMERICAN: 41 mL/min/{1.73_m2} — AB (ref >59)
Globulin, Total: 2 g/dL (ref 1.5–4.5)
Glucose: 97 mg/dL (ref 65–99)
Potassium: 5.2 mmol/L (ref 3.5–5.2)
SODIUM: 142 mmol/L (ref 134–144)
TOTAL PROTEIN: 6.6 g/dL (ref 6.0–8.5)

## 2016-09-22 LAB — HEMOGLOBIN A1C
Est. average glucose Bld gHb Est-mCnc: 120 mg/dL
HEMOGLOBIN A1C: 5.8 % — AB (ref 4.8–5.6)

## 2016-10-02 DIAGNOSIS — Z85528 Personal history of other malignant neoplasm of kidney: Secondary | ICD-10-CM | POA: Insufficient documentation

## 2016-10-28 ENCOUNTER — Other Ambulatory Visit: Payer: Self-pay | Admitting: Family Medicine

## 2016-11-24 ENCOUNTER — Other Ambulatory Visit: Payer: Self-pay | Admitting: Family Medicine

## 2016-11-24 DIAGNOSIS — Z1231 Encounter for screening mammogram for malignant neoplasm of breast: Secondary | ICD-10-CM

## 2016-11-29 ENCOUNTER — Ambulatory Visit
Admission: RE | Admit: 2016-11-29 | Discharge: 2016-11-29 | Disposition: A | Discharge status: Home / Self Care | Payer: Medicare Other | Source: Ambulatory Visit | Attending: Family Medicine | Admitting: Family Medicine

## 2016-11-29 DIAGNOSIS — Z1231 Encounter for screening mammogram for malignant neoplasm of breast: Secondary | ICD-10-CM | POA: Insufficient documentation

## 2017-01-03 ENCOUNTER — Ambulatory Visit
Admission: RE | Admit: 2017-01-03 | Discharge: 2017-01-03 | Disposition: A | Discharge status: Home / Self Care | Payer: Medicare Other | Source: Ambulatory Visit | Attending: Family Medicine | Admitting: Family Medicine

## 2017-01-03 DIAGNOSIS — E2839 Other primary ovarian failure: Secondary | ICD-10-CM | POA: Diagnosis not present

## 2017-01-03 DIAGNOSIS — Z780 Asymptomatic menopausal state: Secondary | ICD-10-CM | POA: Diagnosis not present

## 2017-01-03 DIAGNOSIS — M818 Other osteoporosis without current pathological fracture: Secondary | ICD-10-CM | POA: Diagnosis not present

## 2017-01-03 DIAGNOSIS — M8589 Other specified disorders of bone density and structure, multiple sites: Secondary | ICD-10-CM | POA: Diagnosis not present

## 2017-01-03 DIAGNOSIS — M810 Age-related osteoporosis without current pathological fracture: Secondary | ICD-10-CM | POA: Diagnosis not present

## 2017-01-05 DIAGNOSIS — Z23 Encounter for immunization: Secondary | ICD-10-CM | POA: Diagnosis not present

## 2017-01-26 ENCOUNTER — Other Ambulatory Visit: Payer: Self-pay | Admitting: Family Medicine

## 2017-03-15 ENCOUNTER — Encounter: Payer: Medicare Other | Admitting: Family Medicine

## 2017-03-31 DIAGNOSIS — N9489 Other specified conditions associated with female genital organs and menstrual cycle: Secondary | ICD-10-CM | POA: Diagnosis not present

## 2017-03-31 DIAGNOSIS — Z85528 Personal history of other malignant neoplasm of kidney: Secondary | ICD-10-CM | POA: Diagnosis not present

## 2017-03-31 DIAGNOSIS — Z08 Encounter for follow-up examination after completed treatment for malignant neoplasm: Secondary | ICD-10-CM | POA: Diagnosis not present

## 2017-03-31 DIAGNOSIS — Z905 Acquired absence of kidney: Secondary | ICD-10-CM | POA: Diagnosis not present

## 2017-03-31 DIAGNOSIS — R3129 Other microscopic hematuria: Secondary | ICD-10-CM | POA: Diagnosis not present

## 2017-03-31 DIAGNOSIS — N281 Cyst of kidney, acquired: Secondary | ICD-10-CM | POA: Diagnosis not present

## 2017-03-31 DIAGNOSIS — Z6822 Body mass index (BMI) 22.0-22.9, adult: Secondary | ICD-10-CM | POA: Diagnosis not present

## 2017-04-05 ENCOUNTER — Encounter: Payer: Medicare Other | Admitting: Family Medicine

## 2017-04-05 DIAGNOSIS — L570 Actinic keratosis: Secondary | ICD-10-CM | POA: Diagnosis not present

## 2017-04-05 DIAGNOSIS — D225 Melanocytic nevi of trunk: Secondary | ICD-10-CM | POA: Diagnosis not present

## 2017-04-05 DIAGNOSIS — L814 Other melanin hyperpigmentation: Secondary | ICD-10-CM | POA: Diagnosis not present

## 2017-04-05 DIAGNOSIS — Z85828 Personal history of other malignant neoplasm of skin: Secondary | ICD-10-CM | POA: Diagnosis not present

## 2017-04-05 DIAGNOSIS — X32XXXA Exposure to sunlight, initial encounter: Secondary | ICD-10-CM | POA: Diagnosis not present

## 2017-04-05 DIAGNOSIS — L738 Other specified follicular disorders: Secondary | ICD-10-CM | POA: Diagnosis not present

## 2017-04-15 ENCOUNTER — Telehealth: Payer: Self-pay | Admitting: Family Medicine

## 2017-04-15 ENCOUNTER — Ambulatory Visit: Payer: Medicare Other | Admitting: Family Medicine

## 2017-04-15 NOTE — Telephone Encounter (Signed)
error 

## 2017-04-25 ENCOUNTER — Other Ambulatory Visit: Payer: Self-pay | Admitting: Family Medicine

## 2017-04-25 NOTE — Telephone Encounter (Signed)
  Last OV: 09/21/16 Last Refill:03/16/16 #90 3 RF Pharmacy:

## 2017-04-25 NOTE — Telephone Encounter (Signed)
lexapro refill Last OV: 09/21/16 Last Refill:03/16/16 Pharmacy:  Atorvastatin  refill Last OV: 09/21/16 Last Refill:03/16/16 Pharmacy:  omeprazole refill Last OV: 09/21/16 Last Refill:01/27/17 Pharmacy:

## 2017-06-05 ENCOUNTER — Encounter: Payer: Medicare Other | Admitting: Family Medicine

## 2017-07-12 ENCOUNTER — Ambulatory Visit (INDEPENDENT_AMBULATORY_CARE_PROVIDER_SITE_OTHER): Payer: Medicare Other | Admitting: Family Medicine

## 2017-07-12 ENCOUNTER — Encounter: Payer: Self-pay | Admitting: Family Medicine

## 2017-07-12 ENCOUNTER — Other Ambulatory Visit: Payer: Self-pay

## 2017-07-12 VITALS — BP 134/90 | HR 116 | Temp 98.3°F | Resp 16 | Ht 63.58 in | Wt 156.6 lb

## 2017-07-12 DIAGNOSIS — I10 Essential (primary) hypertension: Secondary | ICD-10-CM | POA: Diagnosis not present

## 2017-07-12 DIAGNOSIS — Z85528 Personal history of other malignant neoplasm of kidney: Secondary | ICD-10-CM

## 2017-07-12 DIAGNOSIS — Z0000 Encounter for general adult medical examination without abnormal findings: Secondary | ICD-10-CM

## 2017-07-12 DIAGNOSIS — Z818 Family history of other mental and behavioral disorders: Secondary | ICD-10-CM

## 2017-07-12 DIAGNOSIS — F4321 Adjustment disorder with depressed mood: Secondary | ICD-10-CM

## 2017-07-12 DIAGNOSIS — E7800 Pure hypercholesterolemia, unspecified: Secondary | ICD-10-CM

## 2017-07-12 DIAGNOSIS — M85851 Other specified disorders of bone density and structure, right thigh: Secondary | ICD-10-CM

## 2017-07-12 DIAGNOSIS — M85852 Other specified disorders of bone density and structure, left thigh: Secondary | ICD-10-CM

## 2017-07-12 DIAGNOSIS — F419 Anxiety disorder, unspecified: Secondary | ICD-10-CM

## 2017-07-12 DIAGNOSIS — R739 Hyperglycemia, unspecified: Secondary | ICD-10-CM | POA: Diagnosis not present

## 2017-07-12 DIAGNOSIS — R3129 Other microscopic hematuria: Secondary | ICD-10-CM | POA: Diagnosis not present

## 2017-07-12 DIAGNOSIS — Z6827 Body mass index (BMI) 27.0-27.9, adult: Secondary | ICD-10-CM | POA: Diagnosis not present

## 2017-07-12 DIAGNOSIS — R7302 Impaired glucose tolerance (oral): Secondary | ICD-10-CM

## 2017-07-12 DIAGNOSIS — F329 Major depressive disorder, single episode, unspecified: Secondary | ICD-10-CM

## 2017-07-12 DIAGNOSIS — F32A Depression, unspecified: Secondary | ICD-10-CM

## 2017-07-12 LAB — POCT URINALYSIS DIP (MANUAL ENTRY)
Bilirubin, UA: NEGATIVE
GLUCOSE UA: NEGATIVE mg/dL
Ketones, POC UA: NEGATIVE mg/dL
LEUKOCYTES UA: NEGATIVE
NITRITE UA: NEGATIVE
PH UA: 6.5 (ref 5.0–8.0)
Protein Ur, POC: NEGATIVE mg/dL
Spec Grav, UA: 1.02 (ref 1.010–1.025)
Urobilinogen, UA: 0.2 E.U./dL

## 2017-07-12 MED ORDER — LOSARTAN POTASSIUM 50 MG PO TABS: 50.0000 mg | ORAL_TABLET | Freq: Every day | ORAL | 3 refills | Status: DC
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
## 2017-07-12 NOTE — Patient Instructions (Addendum)
  [facility] IN [place].     IF you received an x-ray today, you will receive an invoice from [facility]. Please contact [facility] at [phone] with questions or concerns regarding your invoice.   IF you received labwork today, you will receive an invoice from [place]. Please contact [facility] at [phone] with questions or concerns regarding your invoice.   Our billing staff will not be able to assist you with questions regarding bills from these companies.  You will be contacted with the lab results as soon as they are available. The fastest way to get your results is to activate your My Chart account. Instructions are located on the last page of this paperwork. If you have not heard from [place] regarding the results in 2 weeks, please contact this office.

## 2017-07-13 DIAGNOSIS — M545 Low back pain: Secondary | ICD-10-CM | POA: Diagnosis not present

## 2017-07-13 LAB — COMPREHENSIVE METABOLIC PANEL
ALK PHOS: 44 IU/L (ref 39–117)
ALT: 16 IU/L (ref 0–32)
AST: 15 IU/L (ref 0–40)
Albumin/Globulin Ratio: 2.3 — ABNORMAL HIGH (ref 1.2–2.2)
Albumin: 4.5 g/dL (ref 3.6–4.8)
BUN/Creatinine Ratio: 24 (ref 12–28)
BUN: 28 mg/dL — AB (ref 8–27)
Bilirubin Total: 0.4 mg/dL (ref 0.0–1.2)
CHLORIDE: 106 mmol/L (ref 96–106)
CO2: 23 mmol/L (ref 20–29)
Calcium: 9.9 mg/dL (ref 8.7–10.3)
Creatinine, Ser: 1.19 mg/dL — ABNORMAL HIGH (ref 0.57–1.00)
GFR calc Af Amer: 54 mL/min/{1.73_m2} — ABNORMAL LOW (ref >59)
GFR calc non Af Amer: 47 mL/min/{1.73_m2} — ABNORMAL LOW (ref >59)
GLUCOSE: 97 mg/dL (ref 65–99)
Globulin, Total: 2 g/dL (ref 1.5–4.5)
Potassium: 4.7 mmol/L (ref 3.5–5.2)
Sodium: 142 mmol/L (ref 134–144)
Total Protein: 6.5 g/dL (ref 6.0–8.5)

## 2017-07-13 LAB — LIPID PANEL
CHOL/HDL RATIO: 2.6 ratio (ref 0.0–4.4)
CHOLESTEROL TOTAL: 169 mg/dL (ref 100–199)
HDL: 66 mg/dL (ref >39)
LDL Calculated: 88 mg/dL (ref 0–99)
TRIGLYCERIDES: 73 mg/dL (ref 0–149)
VLDL Cholesterol Cal: 15 mg/dL (ref 5–40)

## 2017-07-13 LAB — CBC WITH DIFFERENTIAL/PLATELET
BASOS: 1 %
Basophils Absolute: 0 10*3/uL (ref 0.0–0.2)
EOS (ABSOLUTE): 0.1 10*3/uL (ref 0.0–0.4)
EOS: 2 %
HEMATOCRIT: 42.4 % (ref 34.0–46.6)
Hemoglobin: 13.6 g/dL (ref 11.1–15.9)
IMMATURE GRANULOCYTES: 0 %
Immature Grans (Abs): 0 10*3/uL (ref 0.0–0.1)
Lymphocytes Absolute: 1.6 10*3/uL (ref 0.7–3.1)
Lymphs: 22 %
MCH: 28.7 pg (ref 26.6–33.0)
MCHC: 32.1 g/dL (ref 31.5–35.7)
MCV: 90 fL (ref 79–97)
MONOCYTES: 6 %
MONOS ABS: 0.4 10*3/uL (ref 0.1–0.9)
NEUTROS PCT: 69 %
Neutrophils Absolute: 5.2 10*3/uL (ref 1.4–7.0)
PLATELETS: 283 10*3/uL (ref 150–379)
RBC: 4.74 x10E6/uL (ref 3.77–5.28)
RDW: 13.1 % (ref 12.3–15.4)
WBC: 7.3 10*3/uL (ref 3.4–10.8)

## 2017-07-13 LAB — HEMOGLOBIN A1C
Est. average glucose Bld gHb Est-mCnc: 123 mg/dL
HEMOGLOBIN A1C: 5.9 % — AB (ref 4.8–5.6)

## 2017-07-20 DIAGNOSIS — M545 Low back pain: Secondary | ICD-10-CM | POA: Diagnosis not present

## 2017-07-26 DIAGNOSIS — M545 Low back pain: Secondary | ICD-10-CM | POA: Diagnosis not present

## 2017-07-27 DIAGNOSIS — H2513 Age-related nuclear cataract, bilateral: Secondary | ICD-10-CM | POA: Diagnosis not present

## 2017-08-02 DIAGNOSIS — M545 Low back pain: Secondary | ICD-10-CM | POA: Diagnosis not present

## 2017-08-08 ENCOUNTER — Encounter: Payer: Self-pay | Admitting: Family Medicine

## 2017-09-30 ENCOUNTER — Encounter: Payer: Self-pay | Admitting: Family Medicine

## 2017-09-30 MED ORDER — OMEPRAZOLE 20 MG PO CPDR: 20.0000 mg | DELAYED_RELEASE_CAPSULE | Freq: Every day | ORAL | 3 refills | Status: DC

## 2017-10-17 DIAGNOSIS — B0052 Herpesviral keratitis: Secondary | ICD-10-CM | POA: Diagnosis not present

## 2017-10-25 DIAGNOSIS — B0052 Herpesviral keratitis: Secondary | ICD-10-CM | POA: Diagnosis not present

## 2017-12-21 ENCOUNTER — Other Ambulatory Visit: Payer: Self-pay | Admitting: Family Medicine

## 2017-12-21 DIAGNOSIS — Z1231 Encounter for screening mammogram for malignant neoplasm of breast: Secondary | ICD-10-CM

## 2017-12-27 ENCOUNTER — Ambulatory Visit
Admission: RE | Admit: 2017-12-27 | Discharge: 2017-12-27 | Disposition: A | Discharge status: Home / Self Care | Payer: Medicare Other | Source: Ambulatory Visit | Attending: Family Medicine | Admitting: Family Medicine

## 2017-12-27 DIAGNOSIS — Z1231 Encounter for screening mammogram for malignant neoplasm of breast: Secondary | ICD-10-CM | POA: Diagnosis not present

## 2018-01-03 ENCOUNTER — Other Ambulatory Visit: Payer: Self-pay | Admitting: Family Medicine

## 2018-01-03 DIAGNOSIS — N6320 Unspecified lump in the left breast, unspecified quadrant: Secondary | ICD-10-CM

## 2018-01-03 DIAGNOSIS — R928 Other abnormal and inconclusive findings on diagnostic imaging of breast: Secondary | ICD-10-CM

## 2018-01-08 ENCOUNTER — Ambulatory Visit
Admission: RE | Admit: 2018-01-08 | Discharge: 2018-01-08 | Disposition: A | Discharge status: Home / Self Care | Payer: Medicare Other | Source: Ambulatory Visit | Attending: Family Medicine | Admitting: Family Medicine

## 2018-01-08 DIAGNOSIS — N6320 Unspecified lump in the left breast, unspecified quadrant: Secondary | ICD-10-CM

## 2018-01-08 DIAGNOSIS — N6322 Unspecified lump in the left breast, upper inner quadrant: Secondary | ICD-10-CM | POA: Diagnosis not present

## 2018-01-08 DIAGNOSIS — N6324 Unspecified lump in the left breast, lower inner quadrant: Secondary | ICD-10-CM | POA: Diagnosis not present

## 2018-01-08 DIAGNOSIS — R928 Other abnormal and inconclusive findings on diagnostic imaging of breast: Secondary | ICD-10-CM

## 2018-01-08 DIAGNOSIS — R922 Inconclusive mammogram: Secondary | ICD-10-CM | POA: Diagnosis not present

## 2018-01-10 DIAGNOSIS — Z85528 Personal history of other malignant neoplasm of kidney: Secondary | ICD-10-CM | POA: Diagnosis not present

## 2018-01-10 DIAGNOSIS — F419 Anxiety disorder, unspecified: Secondary | ICD-10-CM | POA: Diagnosis not present

## 2018-01-10 DIAGNOSIS — N6002 Solitary cyst of left breast: Secondary | ICD-10-CM | POA: Diagnosis not present

## 2018-01-10 DIAGNOSIS — Z23 Encounter for immunization: Secondary | ICD-10-CM | POA: Diagnosis not present

## 2018-01-10 DIAGNOSIS — I10 Essential (primary) hypertension: Secondary | ICD-10-CM | POA: Diagnosis not present

## 2018-01-10 DIAGNOSIS — F329 Major depressive disorder, single episode, unspecified: Secondary | ICD-10-CM | POA: Diagnosis not present

## 2018-01-10 DIAGNOSIS — R7302 Impaired glucose tolerance (oral): Secondary | ICD-10-CM | POA: Diagnosis not present

## 2018-01-10 DIAGNOSIS — E7800 Pure hypercholesterolemia, unspecified: Secondary | ICD-10-CM | POA: Diagnosis not present

## 2018-01-10 DIAGNOSIS — M8589 Other specified disorders of bone density and structure, multiple sites: Secondary | ICD-10-CM | POA: Diagnosis not present

## 2018-01-10 DIAGNOSIS — F3341 Major depressive disorder, recurrent, in partial remission: Secondary | ICD-10-CM | POA: Diagnosis not present

## 2018-01-11 ENCOUNTER — Other Ambulatory Visit: Payer: Self-pay | Admitting: Family Medicine

## 2018-01-11 DIAGNOSIS — Z85528 Personal history of other malignant neoplasm of kidney: Secondary | ICD-10-CM | POA: Diagnosis not present

## 2018-01-11 DIAGNOSIS — Z23 Encounter for immunization: Secondary | ICD-10-CM | POA: Diagnosis not present

## 2018-01-11 DIAGNOSIS — F419 Anxiety disorder, unspecified: Secondary | ICD-10-CM | POA: Diagnosis not present

## 2018-01-11 DIAGNOSIS — R928 Other abnormal and inconclusive findings on diagnostic imaging of breast: Secondary | ICD-10-CM

## 2018-01-11 DIAGNOSIS — N6002 Solitary cyst of left breast: Secondary | ICD-10-CM | POA: Diagnosis not present

## 2018-01-11 DIAGNOSIS — F3341 Major depressive disorder, recurrent, in partial remission: Secondary | ICD-10-CM | POA: Diagnosis not present

## 2018-01-11 DIAGNOSIS — R7302 Impaired glucose tolerance (oral): Secondary | ICD-10-CM | POA: Diagnosis not present

## 2018-01-11 DIAGNOSIS — F329 Major depressive disorder, single episode, unspecified: Secondary | ICD-10-CM | POA: Diagnosis not present

## 2018-01-11 DIAGNOSIS — N6320 Unspecified lump in the left breast, unspecified quadrant: Secondary | ICD-10-CM

## 2018-01-11 DIAGNOSIS — E7800 Pure hypercholesterolemia, unspecified: Secondary | ICD-10-CM | POA: Diagnosis not present

## 2018-01-11 DIAGNOSIS — M8589 Other specified disorders of bone density and structure, multiple sites: Secondary | ICD-10-CM | POA: Diagnosis not present

## 2018-01-11 DIAGNOSIS — I10 Essential (primary) hypertension: Secondary | ICD-10-CM | POA: Diagnosis not present

## 2018-01-19 ENCOUNTER — Ambulatory Visit
Admission: RE | Admit: 2018-01-19 | Discharge: 2018-01-19 | Disposition: A | Discharge status: Home / Self Care | Payer: Medicare Other | Source: Ambulatory Visit | Attending: Family Medicine | Admitting: Family Medicine

## 2018-01-19 ENCOUNTER — Other Ambulatory Visit: Payer: Self-pay | Admitting: Family Medicine

## 2018-01-19 DIAGNOSIS — R928 Other abnormal and inconclusive findings on diagnostic imaging of breast: Secondary | ICD-10-CM | POA: Insufficient documentation

## 2018-01-19 DIAGNOSIS — N6489 Other specified disorders of breast: Secondary | ICD-10-CM | POA: Diagnosis not present

## 2018-01-19 DIAGNOSIS — N6325 Unspecified lump in the left breast, overlapping quadrants: Secondary | ICD-10-CM | POA: Diagnosis not present

## 2018-01-19 DIAGNOSIS — N6320 Unspecified lump in the left breast, unspecified quadrant: Secondary | ICD-10-CM

## 2018-01-19 HISTORY — PX: BREAST BIOPSY: SHX20

## 2018-01-22 LAB — SURGICAL PATHOLOGY

## 2018-01-27 ENCOUNTER — Emergency Department
Admission: EM | Admit: 2018-01-27 | Discharge: 2018-01-27 | Disposition: A | Discharge status: Home / Self Care | Payer: Medicare Other | Attending: Emergency Medicine | Admitting: Emergency Medicine

## 2018-01-27 ENCOUNTER — Encounter: Payer: Self-pay | Admitting: Emergency Medicine

## 2018-01-27 ENCOUNTER — Other Ambulatory Visit: Payer: Self-pay

## 2018-01-27 DIAGNOSIS — R319 Hematuria, unspecified: Secondary | ICD-10-CM | POA: Diagnosis present

## 2018-01-27 DIAGNOSIS — N3001 Acute cystitis with hematuria: Secondary | ICD-10-CM | POA: Diagnosis not present

## 2018-01-27 DIAGNOSIS — I10 Essential (primary) hypertension: Secondary | ICD-10-CM | POA: Diagnosis not present

## 2018-01-27 DIAGNOSIS — Z79899 Other long term (current) drug therapy: Secondary | ICD-10-CM | POA: Diagnosis not present

## 2018-01-27 LAB — CBC WITH DIFFERENTIAL/PLATELET
Abs Immature Granulocytes: 0.07 10*3/uL (ref 0.00–0.07)
BASOS ABS: 0.1 10*3/uL (ref 0.0–0.1)
Basophils Relative: 0 %
EOS ABS: 0.1 10*3/uL (ref 0.0–0.5)
EOS PCT: 1 %
HEMATOCRIT: 39 % (ref 36.0–46.0)
Hemoglobin: 12.6 g/dL (ref 12.0–15.0)
Immature Granulocytes: 1 %
LYMPHS ABS: 1.3 10*3/uL (ref 0.7–4.0)
Lymphocytes Relative: 11 %
MCH: 29.2 pg (ref 26.0–34.0)
MCHC: 32.3 g/dL (ref 30.0–36.0)
MCV: 90.5 fL (ref 80.0–100.0)
Monocytes Absolute: 0.6 10*3/uL (ref 0.1–1.0)
Monocytes Relative: 5 %
NRBC: 0 % (ref 0.0–0.2)
Neutro Abs: 9.4 10*3/uL — ABNORMAL HIGH (ref 1.7–7.7)
Neutrophils Relative %: 82 %
Platelets: 255 10*3/uL (ref 150–400)
RBC: 4.31 MIL/uL (ref 3.87–5.11)
RDW: 12.5 % (ref 11.5–15.5)
WBC: 11.5 10*3/uL — ABNORMAL HIGH (ref 4.0–10.5)

## 2018-01-27 LAB — BASIC METABOLIC PANEL
Anion gap: 9 (ref 5–15)
BUN: 26 mg/dL — ABNORMAL HIGH (ref 8–23)
CALCIUM: 9.3 mg/dL (ref 8.9–10.3)
CO2: 22 mmol/L (ref 22–32)
CREATININE: 1.33 mg/dL — AB (ref 0.44–1.00)
Chloride: 109 mmol/L (ref 98–111)
GFR, EST AFRICAN AMERICAN: 46 mL/min — AB (ref >60)
GFR, EST NON AFRICAN AMERICAN: 40 mL/min — AB (ref >60)
Glucose, Bld: 152 mg/dL — ABNORMAL HIGH (ref 70–99)
Potassium: 4 mmol/L (ref 3.5–5.1)
SODIUM: 140 mmol/L (ref 135–145)

## 2018-01-27 LAB — URINALYSIS, COMPLETE (UACMP) WITH MICROSCOPIC
BACTERIA UA: NONE SEEN
RBC / HPF: >50 RBC/hpf — ABNORMAL HIGH (ref 0–5)
SQUAMOUS EPITHELIAL / LPF: NONE SEEN (ref 0–5)
Specific Gravity, Urine: 1.025 (ref 1.005–1.030)
WBC, UA: >50 WBC/hpf — ABNORMAL HIGH (ref 0–5)

## 2018-01-27 MED ORDER — CEPHALEXIN 500 MG PO CAPS: 500.0000 mg | ORAL_CAPSULE | Freq: Two times a day (BID) | ORAL | 0 refills | Status: AC

## 2018-01-27 MED ORDER — CEPHALEXIN 500 MG PO CAPS
500.0000 mg | ORAL_CAPSULE | Freq: Once | ORAL | Status: AC
  Administered 2018-01-27: 500 mg via ORAL
  Filled 2018-01-27: qty 1

## 2018-01-27 NOTE — ED Provider Notes (Signed)
[facility] Emergency Department Provider Note ____________________________________________   First MD Initiated Contact with Patient 01/27/18 [phone]     (approximate)  I have reviewed the triage vital signs and the nursing notes.   HISTORY  Chief Complaint Hematuria    HPI Margaret Massey is a 67 y.o. female with PMH as noted below who presents with hematuria, acute onset this morning, associated with some pressure over her bladder as well as possibly some frequency, but not associated with dysuria, fever, or flank pain.  The patient states that when she was diagnosed with renal cell carcinoma in 2003 she had hematuria but has not really had it since then.  She has had a few UTIs in the interim.  Past Medical History:  Diagnosis Date  . Anxiety   . Cancer [facility])    Renal Cell Carcinoma s/p nephrectomy  . Chronic kidney disease   . Hematuria, unspecified   . Hyperlipidemia   . Hypertension   . Osteoporosis     Patient Active Problem List   Diagnosis Date Noted  . History of renal cell carcinoma 10/02/2016  . Glucose intolerance (impaired glucose tolerance) 07/14/2014  . 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 Parapelvic renal cyst 08/14/2013  . Female genuine stress incontinence 07/11/2012  . Incomplete bladder emptying 07/11/2012  . Hematuria, microscopic 07/11/2012  . Bladder infection, chronic 07/11/2012  . Pure hypercholesterolemia 03/27/2012  . Anxiety and depression 03/27/2012  . Cough 03/27/2012  . Osteopenia 03/27/2012    Past Surgical History:  Procedure Laterality Date  . COLONOSCOPY WITH PROPOFOL N/A 01/29/2015   Procedure: COLONOSCOPY WITH PROPOFOL;  Surgeon: Manya Silvas, MD;  Location: [facility] ENDOSCOPY;  Service: Endoscopy;  Laterality: N/A;  . jaw surgery    . right kidney removal     Renal Cell Carcinoma.    Prior to Admission medications   Medication Sig Start Date End Date Taking? Authorizing Provider  atorvastatin  (LIPITOR) 10 MG tablet TAKE ONE TABLET BY MOUTH EVERY DAY 04/25/17   Wardell Honour, MD  B Complex Vitamins (VITAMIN B COMPLEX PO) Take by mouth.    [provider]  calcium carbonate (CALCIUM 600) 600 MG TABS tablet Take by mouth.    [provider]  cephALEXin (KEFLEX) 500 MG capsule Take 1 capsule (500 mg total) by mouth 2 (two) times daily for 7 days. 01/27/18 02/03/18  Arta Silence, MD  Cholecalciferol (VITAMIN D-3) 1000 UNITS CAPS Take 1 capsule by mouth daily.    [provider]  escitalopram (LEXAPRO) 20 MG tablet TAKE ONE TABLET BY MOUTH EVERY DAY 04/25/17   Wardell Honour, MD  fish oil-omega-3 fatty acids 1000 MG capsule Take 2 g by mouth daily.    [provider]  losartan (COZAAR) 50 MG tablet Take 1 tablet (50 mg total) by mouth daily. 07/12/17   Wardell Honour, MD  Multiple Vitamin (MULTIVITAMIN) tablet Take 1 tablet by mouth daily.    [provider]  omeprazole (PRILOSEC) 20 MG capsule Take 1 capsule (20 mg total) by mouth daily. 09/30/17   Wardell Honour, MD  OVER THE COUNTER MEDICATION Calcium D3 750 mg taking daily    [provider]    Allergies Morphine and related  Family History  Problem Relation Age of Onset  . Heart disease Father   . Heart disease Sister        pacemaker  . Diabetes Sister   . Hyperlipidemia Sister   . Hypertension Mother   .  Depression Mother   . Osteoporosis Mother   . Heart disease Maternal Grandmother   . Heart disease Maternal Grandfather   . Breast cancer Maternal Aunt     Social History Social History   Tobacco Use  . Smoking status: Never Smoker  . Smokeless tobacco: Never Used  Substance Use Topics  . Alcohol use: Yes    Alcohol/week: 4.0 - 5.0 standard drinks    Types: 4 - 5 Glasses of wine per week    Comment: 1 glass of wine per month on average.  . Drug use: No    Review of Systems  Constitutional: No fever. Eyes: No redness. ENT: No sore  throat. Cardiovascular: Denies chest pain. Respiratory: Denies shortness of breath. Gastrointestinal: No nausea, vomiting, or abdominal pain.  Genitourinary: Positive for hematuria.  Musculoskeletal: Negative for back pain. Skin: Negative for rash. Neurological: Negative for headache.   ____________________________________________   PHYSICAL EXAM:  VITAL SIGNS: ED Triage Vitals [01/27/18 0747]  Enc Vitals Group     BP 134/73     Pulse Rate 95     Resp 20     Temp 99 F (37.2 C)     Temp Source Oral     SpO2 96 %     Weight 145 lb (65.8 kg)     Height 5\' 3"  (1.6 m)     Head Circumference      Peak Flow      Pain Score 0     Pain Loc      Pain Edu?      Excl. in [place]?     Constitutional: Alert and oriented. Well appearing and in no acute distress. Eyes: Conjunctivae are normal.  Head: Atraumatic. Nose: No congestion/rhinnorhea. Mouth/Throat: Mucous membranes are moist.   Neck: Normal range of motion.  Cardiovascular: Good peripheral circulation. Respiratory: Normal respiratory effort.   Gastrointestinal: Soft and nontender. No distention.  Genitourinary: No CVA tenderness. Musculoskeletal: Extremities warm and well perfused.  Neurologic:  Normal speech and language. No gross focal neurologic deficits are appreciated.  Skin:  Skin is warm and dry. No rash noted. Psychiatric: Mood and affect are normal. Speech and behavior are normal.  ____________________________________________   LABS (all labs ordered are listed, but only abnormal results are displayed)  Labs Reviewed  BASIC METABOLIC PANEL - Abnormal; Notable for the following components:      Result Value   Glucose, Bld 152 (*)    BUN 26 (*)    Creatinine, Ser 1.33 (*)    GFR calc non Af Amer 40 (*)    GFR calc Af Amer 46 (*)    All other components within normal limits  CBC WITH DIFFERENTIAL/PLATELET - Abnormal; Notable for the following components:   WBC 11.5 (*)    Neutro Abs 9.4 (*)    All other  components within normal limits  URINALYSIS, COMPLETE (UACMP) WITH MICROSCOPIC - Abnormal; Notable for the following components:   Color, Urine RED (*)    APPearance TURBID (*)    Glucose, UA   (*)    Value: TEST NOT REPORTED DUE TO COLOR INTERFERENCE OF URINE PIGMENT   Hgb urine dipstick   (*)    Value: TEST NOT REPORTED DUE TO COLOR INTERFERENCE OF URINE PIGMENT   Bilirubin Urine   (*)    Value: TEST NOT REPORTED DUE TO COLOR INTERFERENCE OF URINE PIGMENT   Ketones, ur   (*)    Value: TEST NOT REPORTED DUE TO COLOR INTERFERENCE OF  URINE PIGMENT   Protein, ur   (*)    Value: TEST NOT REPORTED DUE TO COLOR INTERFERENCE OF URINE PIGMENT   Nitrite   (*)    Value: TEST NOT REPORTED DUE TO COLOR INTERFERENCE OF URINE PIGMENT   Leukocytes, UA   (*)    Value: TEST NOT REPORTED DUE TO COLOR INTERFERENCE OF URINE PIGMENT   RBC / HPF >50 (*)    WBC, UA >50 (*)    All other components within normal limits  URINE CULTURE   ____________________________________________  EKG   ____________________________________________  RADIOLOGY    ____________________________________________   PROCEDURES  Procedure(s) performed: No  Procedures  Critical Care performed: No ____________________________________________   INITIAL IMPRESSION / ASSESSMENT AND PLAN / ED COURSE  Pertinent labs & imaging results that were available during my care of the patient were reviewed by me and considered in my medical decision making (see chart for details).  67 year old female with prior history of renal cell carcinoma presents with acute onset of hematuria and suprapubic pressure this morning.  She denies back or flank pain.  On exam she is relatively well-appearing.  Her vital signs are normal except for possible low-grade temperature.  Her abdomen is soft and nontender, and she has no flank or CVA tenderness.  Overall presentation is most consistent with UTI/cystitis.  Differential also includes  ureteral stone, or less likely recurrent RCC or other renal etiology.  We will obtain basic labs and UA.  If these are consistent with UTI, I will treat, and if the results are ambiguous will consider imaging.  ----------------------------------------- 9:16 AM on 01/27/2018 -----------------------------------------  UA although limited due to gross hematuria shows both significant WBCs and RBCs.  The patient's blood WBC count is also slightly elevated.  This and the clinical presentation are all consistent with acute cystitis.  I will give a course of antibiotics.  I gave the patient thorough return precautions, she expressed understanding.  I also advised her that although a UTI is the most likely cause of her symptoms, given her RCC history, if the hematuria persists she definitely will need further work-up as an outpatient.  She understands this as well. ____________________________________________   FINAL CLINICAL IMPRESSION(S) / ED DIAGNOSES  Final diagnoses:  Hematuria, unspecified type  Acute cystitis with hematuria      NEW MEDICATIONS STARTED DURING THIS VISIT:  New Prescriptions   CEPHALEXIN (KEFLEX) 500 MG CAPSULE    Take 1 capsule (500 mg total) by mouth 2 (two) times daily for 7 days.     Note:  This document was prepared using Dragon voice recognition software and may include unintentional dictation errors.    Arta Silence, MD 01/27/18 [phone]

## 2018-01-27 NOTE — Discharge Instructions (Addendum)
Your work-up points to a bladder infection is the most likely cause of your bleeding.  Take the antibiotic as prescribed and finish the full course.  Follow-up with your regular doctors.  If the bleeding does not resolve after the infection is treated, you may need further work-up as an outpatient.  Return to the ER for new, worsening, or persistent bleeding, weakness or lightheadedness, fevers, vomiting, abdominal or back pain, or any other new or worsening symptoms that concern you.

## 2018-01-27 NOTE — ED Triage Notes (Signed)
Started with hematuria with clots this morning. Last time this happened was told had renal CA and right kidney removed some pressure in bladder area but no other pains. No vomiting.

## 2018-01-29 LAB — URINE CULTURE: Culture: >=100000 — AB

## 2018-02-02 DIAGNOSIS — R310 Gross hematuria: Secondary | ICD-10-CM | POA: Diagnosis not present

## 2018-02-02 DIAGNOSIS — D242 Benign neoplasm of left breast: Secondary | ICD-10-CM | POA: Diagnosis not present

## 2018-02-02 DIAGNOSIS — R490 Dysphonia: Secondary | ICD-10-CM | POA: Diagnosis not present

## 2018-02-02 DIAGNOSIS — Z85528 Personal history of other malignant neoplasm of kidney: Secondary | ICD-10-CM | POA: Diagnosis not present

## 2018-02-02 DIAGNOSIS — N3090 Cystitis, unspecified without hematuria: Secondary | ICD-10-CM | POA: Diagnosis not present

## 2018-02-10 ENCOUNTER — Encounter: Payer: Self-pay | Admitting: Emergency Medicine

## 2018-02-10 ENCOUNTER — Emergency Department
Admission: EM | Admit: 2018-02-10 | Discharge: 2018-02-10 | Disposition: A | Discharge status: Home / Self Care | Payer: Medicare Other | Attending: Emergency Medicine | Admitting: Emergency Medicine

## 2018-02-10 ENCOUNTER — Other Ambulatory Visit: Payer: Self-pay

## 2018-02-10 DIAGNOSIS — R310 Gross hematuria: Secondary | ICD-10-CM | POA: Diagnosis not present

## 2018-02-10 DIAGNOSIS — R319 Hematuria, unspecified: Secondary | ICD-10-CM | POA: Diagnosis present

## 2018-02-10 DIAGNOSIS — Z8553 Personal history of malignant neoplasm of renal pelvis: Secondary | ICD-10-CM | POA: Diagnosis not present

## 2018-02-10 DIAGNOSIS — Z79899 Other long term (current) drug therapy: Secondary | ICD-10-CM | POA: Diagnosis not present

## 2018-02-10 DIAGNOSIS — N3090 Cystitis, unspecified without hematuria: Secondary | ICD-10-CM | POA: Diagnosis not present

## 2018-02-10 DIAGNOSIS — N189 Chronic kidney disease, unspecified: Secondary | ICD-10-CM | POA: Insufficient documentation

## 2018-02-10 DIAGNOSIS — N3091 Cystitis, unspecified with hematuria: Secondary | ICD-10-CM | POA: Diagnosis not present

## 2018-02-10 DIAGNOSIS — I129 Hypertensive chronic kidney disease with stage 1 through stage 4 chronic kidney disease, or unspecified chronic kidney disease: Secondary | ICD-10-CM | POA: Diagnosis not present

## 2018-02-10 LAB — URINALYSIS, COMPLETE (UACMP) WITH MICROSCOPIC
BILIRUBIN URINE: NEGATIVE
Bacteria, UA: NONE SEEN
Glucose, UA: NEGATIVE mg/dL
KETONES UR: NEGATIVE mg/dL
Nitrite: NEGATIVE
PH: 5 (ref 5.0–8.0)
PROTEIN: 30 mg/dL — AB
Specific Gravity, Urine: 1.018 (ref 1.005–1.030)
Squamous Epithelial / HPF: NONE SEEN (ref 0–5)

## 2018-02-10 MED ORDER — FLUCONAZOLE 50 MG PO TABS
150.0000 mg | ORAL_TABLET | Freq: Once | ORAL | Status: AC
  Administered 2018-02-10: 150 mg via ORAL
  Filled 2018-02-10: qty 1

## 2018-02-10 MED ORDER — FLUCONAZOLE 150 MG PO TABS: 150.0000 mg | ORAL_TABLET | Freq: Once | ORAL | 0 refills | Status: AC

## 2018-02-10 MED ORDER — CEPHALEXIN 500 MG PO CAPS: 500.0000 mg | ORAL_CAPSULE | Freq: Two times a day (BID) | ORAL | 0 refills | Status: AC

## 2018-02-10 NOTE — ED Notes (Signed)
See triage note  States she noticed some blood in her urine this am  States she was seen about 2 weeks ago for same and then was seen by her PCP   Denies any flank pain but has urinary pressure and freq

## 2018-02-10 NOTE — Discharge Instructions (Addendum)
Take the antibiotic as directed. Follow-up with your provider and specialist as discussed. Return to the ED as needed.

## 2018-02-10 NOTE — ED Triage Notes (Addendum)
States has blood in urine this am. History of previous episode 2 weeks ago. States history kidney cancer with nephrectomy. Does have a follow up visit urology January 2nd. Was put on antibiotic after last visit here.

## 2018-02-10 NOTE — ED Provider Notes (Signed)
[facility] Emergency Department Provider Note ____________________________________________  Time seen: 1135  I have reviewed the triage vital signs and the nursing notes.  HISTORY  Chief Complaint  Hematuria  HPI Margaret Massey is a 67 y.o. female who presents to the ED accompanied by her husband, for evaluation of single episode of gross  hematuria this morning.  Patient with a history of chronic microscopic  hematuria and renal cell carcinoma status post a right nephrectomy, presents for symptoms of "pink" urine, some pelvic pressure and fullness.  She denies any nausea, vomiting, dizziness patient also denies any flank pain, or urinary retention.  She had been seen about 2 weeks prior for similar episode of gross hematuria, and treated with antibiotics.  She describes her symptoms resolved after completing a one-week course of antibiotics.  She subsequently followed up with her primary provider who reevaluated her and confirmed resolution of her hematuria and leukocyturia.  She apparently had one single pill remaining, and took that Keflex this morning.  She presents now for further evaluation.  Past Medical History:  Diagnosis Date  . Anxiety   . Cancer [facility])    Renal Cell Carcinoma s/p nephrectomy  . Chronic kidney disease   . Hematuria, unspecified   . Hyperlipidemia   . Hypertension   . Osteoporosis     Patient Active Problem List   Diagnosis Date Noted  . History of renal cell carcinoma 10/02/2016  . Glucose intolerance (impaired glucose tolerance) 07/14/2014  . 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 Parapelvic renal cyst 08/14/2013  . Female genuine stress incontinence 07/11/2012  . Incomplete bladder emptying 07/11/2012  . Hematuria, microscopic 07/11/2012  . Bladder infection, chronic 07/11/2012  . Pure hypercholesterolemia 03/27/2012  . Anxiety and depression 03/27/2012  . Cough 03/27/2012  . Osteopenia 03/27/2012    Past Surgical History:   Procedure Laterality Date  . COLONOSCOPY WITH PROPOFOL N/A 01/29/2015   Procedure: COLONOSCOPY WITH PROPOFOL;  Surgeon: Manya Silvas, MD;  Location: [facility] ENDOSCOPY;  Service: Endoscopy;  Laterality: N/A;  . jaw surgery    . right kidney removal     Renal Cell Carcinoma.    Prior to Admission medications   Medication Sig Start Date End Date Taking? Authorizing Provider  atorvastatin (LIPITOR) 10 MG tablet TAKE ONE TABLET BY MOUTH EVERY DAY 04/25/17   Wardell Honour, MD  B Complex Vitamins (VITAMIN B COMPLEX PO) Take by mouth.    [provider]  calcium carbonate (CALCIUM 600) 600 MG TABS tablet Take by mouth.    [provider]  cephALEXin (KEFLEX) 500 MG capsule Take 1 capsule (500 mg total) by mouth 2 (two) times daily for 7 days. 02/10/18 02/17/18  Samir Ishaq, Dannielle Karvonen, PA-C  Cholecalciferol (VITAMIN D-3) 1000 UNITS CAPS Take 1 capsule by mouth daily.    [provider]  escitalopram (LEXAPRO) 20 MG tablet TAKE ONE TABLET BY MOUTH EVERY DAY 04/25/17   Wardell Honour, MD  fish oil-omega-3 fatty acids 1000 MG capsule Take 2 g by mouth daily.    [provider]  fluconazole (DIFLUCAN) 150 MG tablet Take 1 tablet (150 mg total) by mouth once for 1 dose. 02/10/18 02/10/18  Anatalia Kronk, Dannielle Karvonen, PA-C  losartan (COZAAR) 50 MG tablet Take 1 tablet (50 mg total) by mouth daily. 07/12/17   Wardell Honour, MD  Multiple Vitamin (MULTIVITAMIN) tablet Take 1 tablet by mouth daily.    [provider]  omeprazole (PRILOSEC) 20 MG capsule Take  1 capsule (20 mg total) by mouth daily. 09/30/17   Wardell Honour, MD  OVER THE COUNTER MEDICATION Calcium D3 750 mg taking daily    [provider]    Allergies Morphine and related  Family History  Problem Relation Age of Onset  . Heart disease Father   . Heart disease Sister        pacemaker  . Diabetes Sister   . Hyperlipidemia Sister   . Hypertension Mother   . Depression Mother   .  Osteoporosis Mother   . Heart disease Maternal Grandmother   . Heart disease Maternal Grandfather   . Breast cancer Maternal Aunt     Social History Social History   Tobacco Use  . Smoking status: Never Smoker  . Smokeless tobacco: Never Used  Substance Use Topics  . Alcohol use: Yes    Alcohol/week: 4.0 - 5.0 standard drinks    Types: 4 - 5 Glasses of wine per week    Comment: 1 glass of wine per month on average.  . Drug use: No    Review of Systems  Constitutional: Negative for fever. Cardiovascular: Negative for chest pain. Respiratory: Negative for shortness of breath. Gastrointestinal: Negative for abdominal pain, vomiting and diarrhea. Genitourinary: Negative for dysuria.  Reports hematuria as above. Musculoskeletal: Negative for back pain. Skin: Negative for rash. Neurological: Negative for headaches, focal weakness or numbness. ____________________________________________  PHYSICAL EXAM:  VITAL SIGNS: ED Triage Vitals  Enc Vitals Group     BP 02/10/18 1058 140/87     Pulse Rate 02/10/18 1058 80     Resp 02/10/18 1058 18     Temp 02/10/18 1058 98.4 F (36.9 C)     Temp Source 02/10/18 1058 Oral     SpO2 02/10/18 1058 98 %     Weight 02/10/18 1059 145 lb (65.8 kg)     Height 02/10/18 1059 5\' 3"  (1.6 m)     Head Circumference --      Peak Flow --      Pain Score 02/10/18 1058 5     Pain Loc --      Pain Edu? --      Excl. in [place]? --     Constitutional: Alert and oriented. Well appearing and in no distress. Head: Normocephalic and atraumatic. Eyes: Conjunctivae are normal. Normal extraocular movements Cardiovascular: Normal rate, regular rhythm. Normal distal pulses. Respiratory: Normal respiratory effort. No wheezes/rales/rhonchi. Gastrointestinal: Soft and nontender. No distention. Musculoskeletal: Nontender with normal range of motion in all extremities.  Neurologic:  Normal gait without ataxia. Normal speech and language. No gross focal neurologic  deficits are appreciated. Skin:  Skin is warm, dry and intact. No rash noted. ____________________________________________   LABS (pertinent positives/negatives) Labs Reviewed  URINALYSIS, COMPLETE (UACMP) WITH MICROSCOPIC - Abnormal; Notable for the following components:      Result Value   Color, Urine YELLOW (*)    APPearance CLOUDY (*)    Hgb urine dipstick LARGE (*)    Protein, ur 30 (*)    Leukocytes, UA LARGE (*)    RBC / HPF >50 (*)    WBC, UA >50 (*)    All other components within normal limits  ____________________________________________  PROCEDURES  Procedures Diflucan 150 mg PO ____________________________________________  INITIAL IMPRESSION / ASSESSMENT AND PLAN / ED COURSE  Patient with ED evaluation of gross hematuria.  Patient's exam is overall benign.  Her urinalysis does confirm large amount of leukocytes, large amount of erythrocytes, and some  budding yeast.  Patient will be treated again with Keflex twice daily for the next 7 days.  She is encouraged to follow-up with primary provider or her urologist ahead of her currently scheduled January appointment.  No signs of any acute pyelo-, urinary retention, or other etiology at this time.  Patient is reassured and will follow-up as suggested. ____________________________________________  FINAL CLINICAL IMPRESSION(S) / ED DIAGNOSES  Final diagnoses:  Cystitis  Gross hematuria      Carmie End, Dannielle Karvonen, PA-C 02/10/18 1553    Harvest Dark, MD 02/11/18 [phone]

## 2018-02-14 ENCOUNTER — Ambulatory Visit (INDEPENDENT_AMBULATORY_CARE_PROVIDER_SITE_OTHER): Payer: Medicare Other | Admitting: Surgery

## 2018-02-14 ENCOUNTER — Other Ambulatory Visit: Payer: Self-pay

## 2018-02-14 ENCOUNTER — Encounter: Payer: Self-pay | Admitting: Surgery

## 2018-02-14 VITALS — BP 125/82 | HR 73 | Temp 97.5°F | Resp 12 | Ht 63.0 in | Wt 157.0 lb

## 2018-02-14 DIAGNOSIS — N6099 Unspecified benign mammary dysplasia of unspecified breast: Secondary | ICD-10-CM

## 2018-02-14 NOTE — Patient Instructions (Signed)
The patient is aware to call back for any questions or new concerns.  Schedule outpatient surgery at [facility] excision left breast mass with needle localization

## 2018-02-14 NOTE — Progress Notes (Signed)
Patient ID: Margaret Massey, female   DOB: 1950/10/04, 67 y.o.   MRN: [id number]  HPI Margaret Massey is a 67 y.o. female being evaluated after left abnormal mammo.  Have a history of renal cell carcinoma several years ago and status post radical nephrectomy on the right side.  She had also some hematuria that has been work-up.  I have encouraged her to contact her urologist soon as possible for further work-up of her hematuria.  She now comes seen with a new abnormal mammogram on the left side and she is status post biopsy showing evidence of intraductal papilloma, no evidence of dysplasia or atypia.  Usual Ductal hyperplasia. History significant for aunt with breast cancer.  No evidence of hormonal therapy before.  Menarche at age 67 the past at age 32. Is concerned about the abnormality becoming breast cancer. Denies any fevers any chills.  No weight loss.  No nipple discharge.  No masses.  Mammo  personally reviewed revealing a small lesion at 9:00 on the left breast.  With some cystic component. Hemoglobin is 12.6 normal platelets.  She did have a urinalysis with hematuria.  Her creatinine is 1.3  HPI  Past Medical History:  Diagnosis Date  . Anxiety   . Cancer [facility]) 2003   Renal Cell Carcinoma s/p nephrectomy  . Chronic kidney disease   . Colon polyp 01/29/2015   TUBULAR ADENOMA  . Hematuria, unspecified   . Hyperlipidemia   . Hypertension   . Osteoporosis     Past Surgical History:  Procedure Laterality Date  . BREAST BIOPSY Left 01/19/2018   BENIGN MAMMARY PARENCHYMA SHOWING USUAL DUCTAL HYPERPLASIA  . COLONOSCOPY WITH PROPOFOL N/A 01/29/2015   Procedure: COLONOSCOPY WITH PROPOFOL;  Surgeon: Manya Silvas, MD;  Location: [facility] ENDOSCOPY;  Service: Endoscopy;  Laterality: N/A;  . jaw surgery    . right kidney removal Right 10/01/2001   Renal Cell Carcinoma.    Family History  Problem Relation Age of Onset  . Heart disease Father   . Heart disease Sister    pacemaker  . Diabetes Sister   . Hyperlipidemia Sister   . Hypertension Mother   . Depression Mother   . Osteoporosis Mother   . Heart disease Maternal Grandmother   . Heart disease Maternal Grandfather   . Breast cancer Maternal Aunt   . Ovarian cancer Neg Hx     Social History Social History   Tobacco Use  . Smoking status: Never Smoker  . Smokeless tobacco: Never Used  Substance Use Topics  . Alcohol use: Yes    Alcohol/week: 4.0 - 5.0 standard drinks    Types: 4 - 5 Glasses of wine per week    Comment: 1 glass of wine per month on average.  . Drug use: No    Allergies  Allergen Reactions  . Morphine And Related Nausea And Vomiting    Current Outpatient Medications  Medication Sig Dispense Refill  . atorvastatin (LIPITOR) 10 MG tablet TAKE ONE TABLET BY MOUTH EVERY DAY 90 tablet 3  . B Complex Vitamins (VITAMIN B COMPLEX PO) Take by mouth.    . calcium carbonate (CALCIUM 600) 600 MG TABS tablet Take by mouth.    . cephALEXin (KEFLEX) 500 MG capsule Take 1 capsule (500 mg total) by mouth 2 (two) times daily for 7 days. 14 capsule 0  . Cholecalciferol (VITAMIN D-3) 1000 UNITS CAPS Take 1 capsule by mouth daily.    Marland Kitchen escitalopram (LEXAPRO) 20 MG tablet TAKE  ONE TABLET BY MOUTH EVERY DAY 90 tablet 3  . fish oil-omega-3 fatty acids 1000 MG capsule Take 2 g by mouth daily.    Marland Kitchen losartan (COZAAR) 50 MG tablet Take 1 tablet (50 mg total) by mouth daily. 90 tablet 3  . Multiple Vitamin (MULTIVITAMIN) tablet Take 1 tablet by mouth daily.    Marland Kitchen omeprazole (PRILOSEC) 20 MG capsule Take 1 capsule (20 mg total) by mouth daily. 90 capsule 3  . OVER THE COUNTER MEDICATION Calcium D3 750 mg taking daily     No current facility-administered medications for this visit.      Review of Systems Full ROS  was asked and was negative except for the information on the HPI  Physical Exam Blood pressure 125/82, pulse 73, temperature (!) 97.5 F (36.4 C), temperature source Skin, resp.  rate 12, height 5\' 3"  (1.6 m), weight 157 lb (71.2 kg), SpO2 97 %. CONSTITUTIONAL: NAD EYES: Pupils are equal, round, and reactive to light, Sclera are non-icteric. EARS, NOSE, MOUTH AND THROAT: The oropharynx is clear. The oral mucosa is pink and moist. Hearing is intact to voice. LYMPH NODES:  Lymph nodes in the neck are normal. RESPIRATORY:  Lungs are clear. There is normal respiratory effort, with equal breath sounds bilaterally, and without pathologic use of accessory muscles. CARDIOVASCULAR: Heart is regular without murmurs, gallops, or rubs. BREAST: No masses, no skin or nipples. No Nipple discharge. No LAD GI: The abdomen is soft, nontender, and nondistended. There are no palpable masses. There is no hepatosplenomegaly. There are normal bowel sounds in all quadrants. GU: Rectal deferred.   MUSCULOSKELETAL: Normal muscle strength and tone. No cyanosis or edema.   SKIN: Turgor is good and there are no pathologic skin lesions or ulcers. NEUROLOGIC: Motor and sensation is grossly normal. Cranial nerves are grossly intact. PSYCH:  Oriented to person, place and time. Affect is normal.  Data Reviewed  I have personally reviewed the patient's imaging, laboratory findings and medical records.    Assessment/Plan  67 year old female abnormal mammogram and some intraductal papilloma and usual hyperplasia.  Described to the patient in detail about the pathology.  I specific explained to her that this is not necessarily a high risk lesion but he may have to continue a more strict screening program.  It is adamant that she wishes to have the area completely excised because she is afraid that this may become cancer and she rather  have peace of mind. With that being said I think that given her patient's issues it is reasonable to offer her a needle guided lumpectomy.  I have discussed with the patient detail about the procedure.  Risk benefit and possible occasions including but not limited to:  Bleeding, infection, seroma and breast deformity.  She understands and agrees with the plan.  Caroleen Hamman, MD FACS General Surgeon 02/14/2018, 1:17 PM

## 2018-02-14 NOTE — H&P (View-Only) (Signed)
Patient ID: Margaret Massey, female   DOB: 09/18/1950, 67 y.o.   MRN: [id number]  HPI Margaret Massey is a 67 y.o. female being evaluated after left abnormal mammo.  Have a history of renal cell carcinoma several years ago and status post radical nephrectomy on the right side.  She had also some hematuria that has been work-up.  I have encouraged her to contact her urologist soon as possible for further work-up of her hematuria.  She now comes seen with a new abnormal mammogram on the left side and she is status post biopsy showing evidence of intraductal papilloma, no evidence of dysplasia or atypia.  Usual Ductal hyperplasia. History significant for aunt with breast cancer.  No evidence of hormonal therapy before.  Menarche at age 63 the past at age 3. Is concerned about the abnormality becoming breast cancer. Denies any fevers any chills.  No weight loss.  No nipple discharge.  No masses.  Mammo  personally reviewed revealing a small lesion at 9:00 on the left breast.  With some cystic component. Hemoglobin is 12.6 normal platelets.  She did have a urinalysis with hematuria.  Her creatinine is 1.3  HPI  Past Medical History:  Diagnosis Date  . Anxiety   . Cancer [facility]) 2003   Renal Cell Carcinoma s/p nephrectomy  . Chronic kidney disease   . Colon polyp 01/29/2015   TUBULAR ADENOMA  . Hematuria, unspecified   . Hyperlipidemia   . Hypertension   . Osteoporosis     Past Surgical History:  Procedure Laterality Date  . BREAST BIOPSY Left 01/19/2018   BENIGN MAMMARY PARENCHYMA SHOWING USUAL DUCTAL HYPERPLASIA  . COLONOSCOPY WITH PROPOFOL N/A 01/29/2015   Procedure: COLONOSCOPY WITH PROPOFOL;  Surgeon: Manya Silvas, MD;  Location: [facility] ENDOSCOPY;  Service: Endoscopy;  Laterality: N/A;  . jaw surgery    . right kidney removal Right 10/01/2001   Renal Cell Carcinoma.    Family History  Problem Relation Age of Onset  . Heart disease Father   . Heart disease Sister    pacemaker  . Diabetes Sister   . Hyperlipidemia Sister   . Hypertension Mother   . Depression Mother   . Osteoporosis Mother   . Heart disease Maternal Grandmother   . Heart disease Maternal Grandfather   . Breast cancer Maternal Aunt   . Ovarian cancer Neg Hx     Social History Social History   Tobacco Use  . Smoking status: Never Smoker  . Smokeless tobacco: Never Used  Substance Use Topics  . Alcohol use: Yes    Alcohol/week: 4.0 - 5.0 standard drinks    Types: 4 - 5 Glasses of wine per week    Comment: 1 glass of wine per month on average.  . Drug use: No    Allergies  Allergen Reactions  . Morphine And Related Nausea And Vomiting    Current Outpatient Medications  Medication Sig Dispense Refill  . atorvastatin (LIPITOR) 10 MG tablet TAKE ONE TABLET BY MOUTH EVERY DAY 90 tablet 3  . B Complex Vitamins (VITAMIN B COMPLEX PO) Take by mouth.    . calcium carbonate (CALCIUM 600) 600 MG TABS tablet Take by mouth.    . cephALEXin (KEFLEX) 500 MG capsule Take 1 capsule (500 mg total) by mouth 2 (two) times daily for 7 days. 14 capsule 0  . Cholecalciferol (VITAMIN D-3) 1000 UNITS CAPS Take 1 capsule by mouth daily.    Marland Kitchen escitalopram (LEXAPRO) 20 MG tablet TAKE  ONE TABLET BY MOUTH EVERY DAY 90 tablet 3  . fish oil-omega-3 fatty acids 1000 MG capsule Take 2 g by mouth daily.    Marland Kitchen losartan (COZAAR) 50 MG tablet Take 1 tablet (50 mg total) by mouth daily. 90 tablet 3  . Multiple Vitamin (MULTIVITAMIN) tablet Take 1 tablet by mouth daily.    Marland Kitchen omeprazole (PRILOSEC) 20 MG capsule Take 1 capsule (20 mg total) by mouth daily. 90 capsule 3  . OVER THE COUNTER MEDICATION Calcium D3 750 mg taking daily     No current facility-administered medications for this visit.      Review of Systems Full ROS  was asked and was negative except for the information on the HPI  Physical Exam Blood pressure 125/82, pulse 73, temperature (!) 97.5 F (36.4 C), temperature source Skin, resp.  rate 12, height 5\' 3"  (1.6 m), weight 157 lb (71.2 kg), SpO2 97 %. CONSTITUTIONAL: NAD EYES: Pupils are equal, round, and reactive to light, Sclera are non-icteric. EARS, NOSE, MOUTH AND THROAT: The oropharynx is clear. The oral mucosa is pink and moist. Hearing is intact to voice. LYMPH NODES:  Lymph nodes in the neck are normal. RESPIRATORY:  Lungs are clear. There is normal respiratory effort, with equal breath sounds bilaterally, and without pathologic use of accessory muscles. CARDIOVASCULAR: Heart is regular without murmurs, gallops, or rubs. BREAST: No masses, no skin or nipples. No Nipple discharge. No LAD GI: The abdomen is soft, nontender, and nondistended. There are no palpable masses. There is no hepatosplenomegaly. There are normal bowel sounds in all quadrants. GU: Rectal deferred.   MUSCULOSKELETAL: Normal muscle strength and tone. No cyanosis or edema.   SKIN: Turgor is good and there are no pathologic skin lesions or ulcers. NEUROLOGIC: Motor and sensation is grossly normal. Cranial nerves are grossly intact. PSYCH:  Oriented to person, place and time. Affect is normal.  Data Reviewed  I have personally reviewed the patient's imaging, laboratory findings and medical records.    Assessment/Plan  67 year old female abnormal mammogram and some intraductal papilloma and usual hyperplasia.  Described to the patient in detail about the pathology.  I specific explained to her that this is not necessarily a high risk lesion but he may have to continue a more strict screening program.  It is adamant that she wishes to have the area completely excised because she is afraid that this may become cancer and she rather  have peace of mind. With that being said I think that given her patient's issues it is reasonable to offer her a needle guided lumpectomy.  I have discussed with the patient detail about the procedure.  Risk benefit and possible occasions including but not limited to:  Bleeding, infection, seroma and breast deformity.  She understands and agrees with the plan.  Caroleen Hamman, MD FACS General Surgeon 02/14/2018, 1:17 PM

## 2018-02-15 ENCOUNTER — Encounter: Payer: Self-pay | Admitting: *Deleted

## 2018-02-15 ENCOUNTER — Other Ambulatory Visit: Payer: Self-pay | Admitting: Surgery

## 2018-02-15 DIAGNOSIS — N6099 Unspecified benign mammary dysplasia of unspecified breast: Secondary | ICD-10-CM

## 2018-02-15 NOTE — Progress Notes (Signed)
Patient's surgery has been scheduled for 02-22-18 at [facility] with Dr. Dahlia Byes. The patient is to check-in at the [facility] at 8:20 am.  The patient is aware she will be contacted by the [place] to complete a phone interview the afternoon of 02-19-18.  The patient is aware to call the office should she have further questions.

## 2018-02-19 ENCOUNTER — Encounter
Admission: RE | Admit: 2018-02-19 | Discharge: 2018-02-19 | Disposition: A | Discharge status: Home / Self Care | Payer: Medicare Other | Source: Ambulatory Visit | Attending: Surgery | Admitting: Surgery

## 2018-02-19 ENCOUNTER — Other Ambulatory Visit: Payer: Self-pay

## 2018-02-19 ENCOUNTER — Telehealth: Payer: Self-pay | Admitting: *Deleted

## 2018-02-19 HISTORY — DX: Family history of other specified conditions: Z84.89

## 2018-02-19 HISTORY — DX: Other specified postprocedural states: R11.2

## 2018-02-19 HISTORY — DX: Gastro-esophageal reflux disease without esophagitis: K21.9

## 2018-02-19 HISTORY — DX: Adverse effect of unspecified anesthetic, initial encounter: T41.45XA

## 2018-02-19 HISTORY — DX: Other specified postprocedural states: Z98.890

## 2018-02-19 HISTORY — DX: Other complications of anesthesia, initial encounter: T88.59XA

## 2018-02-19 NOTE — Telephone Encounter (Signed)
Patient notified of new arrival time on 02-22-18.   She is aware to check in at [facility] at 7:45 am. Patient verbalizes understanding.

## 2018-02-19 NOTE — Telephone Encounter (Signed)
-----   Message from Sherrie Sport sent at 02/19/2018 10:05 AM EST ----- Ok... Tell her to arrive at [facility] at 7:45a  ----- Message ----- From: Dominga Ferry, CMA Sent: 02/19/2018   9:56 AM EST To: Sherrie Sport  I did check with Dr. Dahlia Byes again and he would still like for [place] to move surgery to 9:15 am that morning please.  ----- Message ----- From: Sherrie Sport Sent: 02/19/2018   9:36 AM EST To: Dominga Ferry, CMA  We can move her a little earlier.  But ideally to get the NL performed and up to surgery for prep,  surgery shouldn't be earlier than 10am.    If surgery is at 9:15am we may not be able to get her up to surgery in time. It is my understanding that surgery needs the pt up there 1 hour prior to surgery so that they can do what they need to do before surgery begins. Let me know what you would like to do.  Thanks  ----- Message ----- From: Dominga Ferry, CMA Sent: 02/19/2018   9:25 AM EST To: Sherrie Sport  Dr. Dahlia Byes is asking if we can get patient's surgery moved up to 9:15 am. The O.R[facility] Has availability at this time. Can you move the needle loc up? If so, what would patient's new arrival time be? Thanks.

## 2018-02-19 NOTE — Patient Instructions (Signed)
Your procedure is scheduled on: 02-22-18 THURSDAY Report to [facility] @ 7:45 AM  Remember: Instructions that are not followed completely may result in serious medical risk, up to and including death, or upon the discretion of your surgeon and anesthesiologist your surgery may need to be rescheduled.    _x___ 1. Do not eat food after midnight the night before your procedure. NO GUM OR CANDY AFTER MIDNIGHT. You may drink clear liquids up to 2 hours before you are scheduled to arrive at the hospital for your procedure.  Do not drink clear liquids within 2 hours of your scheduled arrival to the hospital.  Clear liquids include  --Water or Apple juice without pulp  --Clear carbohydrate beverage such as ClearFast or Gatorade  --Black Coffee or Clear Tea (No milk, no creamers, do not add anything to the coffee or Tea   ____Ensure clear carbohydrate drink on the way to the hospital for bariatric patients  ____Ensure clear carbohydrate drink 3 hours before surgery for Dr Dwyane Luo patients if physician instructed.     __x__ 2. No Alcohol for 24 hours before or after surgery.   __x__3. No Smoking or e-cigarettes for 24 prior to surgery.  Do not use any chewable tobacco products for at least 6 hour prior to surgery   ____  4. Bring all medications with you on the day of surgery if instructed.    __x__ 5. Notify your doctor if there is any change in your medical condition     (cold, fever, infections).    x___6. On the morning of surgery brush your teeth with toothpaste and water.  You may rinse your mouth with mouth wash if you wish.  Do not swallow any toothpaste or mouthwash.   Do not wear jewelry, make-up, hairpins, clips or nail polish.  Do not wear lotions, powders, or perfumes. You may wear deodorant.  Do not shave 48 hours prior to surgery. Men may shave face and neck.  Do not bring valuables to the hospital.    [facility] is not responsible for any belongings or valuables.       Contacts, dentures or bridgework may not be worn into surgery.  Leave your suitcase in the car. After surgery it may be brought to your room.  For patients admitted to the hospital, discharge time is determined by your  treatment team.  _  Patients discharged the day of surgery will not be allowed to drive home.  You will need someone to drive you home and stay with you the night of your procedure.    Please read over the following fact sheets that you were given:   [facility] Preparing for Surgery  _x___ TAKE THE FOLLOWING MEDICATION THE MORNING OF SURGERY WITH A SMALL SIP OF WATER. These include:  1. PRILOSEC (OMEPRAZOLE)  2. TAKE A PRILOSEC THE NIGHT BEFORE YOUR SURGERY  3.  4.  5.  6.  ____Fleets enema or Magnesium Citrate as directed.   _x___ Use CHG Soap or sage wipes as directed on instruction sheet   ____ Use inhalers on the day of surgery and bring to hospital day of surgery  ____ Stop Metformin and Janumet 2 days prior to surgery.    ____ Take 1/2 of usual insulin dose the night before surgery and none on the morning surgery.   _x___ Follow recommendations from Cardiologist, Pulmonologist or PCP regarding stopping Aspirin, Coumadin, Plavix ,Eliquis, Effient, or Pradaxa, and Pletal-STOP ASPIRIN NOW  X____Stop Anti-inflammatories such as Advil, Aleve,  Ibuprofen, Motrin, Naproxen, Naprosyn, Goodies powders or aspirin products NOW-OK to take Tylenol   _x___ Stop supplements until after surgery-STOP [place]   ____ Bring C-Pap to the hospital.

## 2018-02-20 ENCOUNTER — Encounter
Admission: RE | Admit: 2018-02-20 | Discharge: 2018-02-20 | Disposition: A | Discharge status: Home / Self Care | Payer: Medicare Other | Source: Ambulatory Visit | Attending: Surgery | Admitting: Surgery

## 2018-02-20 DIAGNOSIS — Z79899 Other long term (current) drug therapy: Secondary | ICD-10-CM | POA: Diagnosis not present

## 2018-02-20 DIAGNOSIS — F419 Anxiety disorder, unspecified: Secondary | ICD-10-CM | POA: Diagnosis not present

## 2018-02-20 DIAGNOSIS — N6489 Other specified disorders of breast: Secondary | ICD-10-CM | POA: Diagnosis present

## 2018-02-20 DIAGNOSIS — I10 Essential (primary) hypertension: Secondary | ICD-10-CM | POA: Insufficient documentation

## 2018-02-20 DIAGNOSIS — Z85528 Personal history of other malignant neoplasm of kidney: Secondary | ICD-10-CM | POA: Diagnosis not present

## 2018-02-20 DIAGNOSIS — I498 Other specified cardiac arrhythmias: Secondary | ICD-10-CM | POA: Insufficient documentation

## 2018-02-20 DIAGNOSIS — Z01810 Encounter for preprocedural cardiovascular examination: Secondary | ICD-10-CM

## 2018-02-20 DIAGNOSIS — Z853 Personal history of malignant neoplasm of breast: Secondary | ICD-10-CM | POA: Diagnosis not present

## 2018-02-20 DIAGNOSIS — N189 Chronic kidney disease, unspecified: Secondary | ICD-10-CM | POA: Diagnosis not present

## 2018-02-20 DIAGNOSIS — N6082 Other benign mammary dysplasias of left breast: Secondary | ICD-10-CM | POA: Diagnosis not present

## 2018-02-20 DIAGNOSIS — Z905 Acquired absence of kidney: Secondary | ICD-10-CM | POA: Diagnosis not present

## 2018-02-20 DIAGNOSIS — Z885 Allergy status to narcotic agent status: Secondary | ICD-10-CM | POA: Diagnosis not present

## 2018-02-20 DIAGNOSIS — E785 Hyperlipidemia, unspecified: Secondary | ICD-10-CM | POA: Diagnosis not present

## 2018-02-20 DIAGNOSIS — I129 Hypertensive chronic kidney disease with stage 1 through stage 4 chronic kidney disease, or unspecified chronic kidney disease: Secondary | ICD-10-CM | POA: Diagnosis not present

## 2018-02-20 DIAGNOSIS — R319 Hematuria, unspecified: Secondary | ICD-10-CM | POA: Diagnosis not present

## 2018-02-20 DIAGNOSIS — Z803 Family history of malignant neoplasm of breast: Secondary | ICD-10-CM | POA: Diagnosis not present

## 2018-02-22 ENCOUNTER — Other Ambulatory Visit: Payer: Self-pay

## 2018-02-22 ENCOUNTER — Ambulatory Visit: Payer: Medicare Other | Admitting: Anesthesiology

## 2018-02-22 ENCOUNTER — Encounter
Admission: RE | Disposition: A | Discharge status: Home / Self Care | Payer: Self-pay | Source: Home / Self Care | Attending: Surgery

## 2018-02-22 ENCOUNTER — Ambulatory Visit
Admission: RE | Admit: 2018-02-22 | Discharge: 2018-02-22 | Disposition: A | Discharge status: Home / Self Care | Payer: Medicare Other | Source: Ambulatory Visit | Attending: Surgery | Admitting: Surgery

## 2018-02-22 ENCOUNTER — Ambulatory Visit
Admission: RE | Admit: 2018-02-22 | Discharge: 2018-02-22 | Disposition: A | Discharge status: Home / Self Care | Payer: Medicare Other | Attending: Surgery | Admitting: Surgery

## 2018-02-22 DIAGNOSIS — N6489 Other specified disorders of breast: Secondary | ICD-10-CM | POA: Diagnosis not present

## 2018-02-22 DIAGNOSIS — F419 Anxiety disorder, unspecified: Secondary | ICD-10-CM | POA: Insufficient documentation

## 2018-02-22 DIAGNOSIS — Z885 Allergy status to narcotic agent status: Secondary | ICD-10-CM | POA: Insufficient documentation

## 2018-02-22 DIAGNOSIS — N6099 Unspecified benign mammary dysplasia of unspecified breast: Secondary | ICD-10-CM

## 2018-02-22 DIAGNOSIS — N6320 Unspecified lump in the left breast, unspecified quadrant: Secondary | ICD-10-CM | POA: Diagnosis not present

## 2018-02-22 DIAGNOSIS — Z85528 Personal history of other malignant neoplasm of kidney: Secondary | ICD-10-CM | POA: Insufficient documentation

## 2018-02-22 DIAGNOSIS — Z803 Family history of malignant neoplasm of breast: Secondary | ICD-10-CM | POA: Insufficient documentation

## 2018-02-22 DIAGNOSIS — N189 Chronic kidney disease, unspecified: Secondary | ICD-10-CM | POA: Insufficient documentation

## 2018-02-22 DIAGNOSIS — I129 Hypertensive chronic kidney disease with stage 1 through stage 4 chronic kidney disease, or unspecified chronic kidney disease: Secondary | ICD-10-CM | POA: Insufficient documentation

## 2018-02-22 DIAGNOSIS — R319 Hematuria, unspecified: Secondary | ICD-10-CM | POA: Insufficient documentation

## 2018-02-22 DIAGNOSIS — Z905 Acquired absence of kidney: Secondary | ICD-10-CM | POA: Insufficient documentation

## 2018-02-22 DIAGNOSIS — N6032 Fibrosclerosis of left breast: Secondary | ICD-10-CM | POA: Diagnosis not present

## 2018-02-22 DIAGNOSIS — Z79899 Other long term (current) drug therapy: Secondary | ICD-10-CM | POA: Insufficient documentation

## 2018-02-22 DIAGNOSIS — N6082 Other benign mammary dysplasias of left breast: Secondary | ICD-10-CM | POA: Insufficient documentation

## 2018-02-22 DIAGNOSIS — Z853 Personal history of malignant neoplasm of breast: Secondary | ICD-10-CM | POA: Insufficient documentation

## 2018-02-22 DIAGNOSIS — E785 Hyperlipidemia, unspecified: Secondary | ICD-10-CM | POA: Insufficient documentation

## 2018-02-22 HISTORY — PX: BREAST LUMPECTOMY WITH NEEDLE LOCALIZATION: SHX5759

## 2018-02-22 HISTORY — PX: BREAST LUMPECTOMY: SHX2

## 2018-02-22 SURGERY — BREAST LUMPECTOMY WITH NEEDLE LOCALIZATION
Anesthesia: General | Laterality: Left

## 2018-02-22 MED ORDER — PROPOFOL 10 MG/ML IV BOLUS
INTRAVENOUS | Status: DC | PRN
  Administered 2018-02-22: 150 mg via INTRAVENOUS
  Administered 2018-02-22: 40 mg via INTRAVENOUS

## 2018-02-22 MED ORDER — PROPOFOL 10 MG/ML IV BOLUS
INTRAVENOUS | Status: AC
  Filled 2018-02-22: qty 40

## 2018-02-22 MED ORDER — BUPIVACAINE-EPINEPHRINE (PF) 0.25% -1:200000 IJ SOLN
INTRAMUSCULAR | Status: AC
  Filled 2018-02-22: qty 30

## 2018-02-22 MED ORDER — MIDAZOLAM HCL 2 MG/2ML IJ SOLN
INTRAMUSCULAR | Status: DC | PRN
  Administered 2018-02-22: 2 mg via INTRAVENOUS

## 2018-02-22 MED ORDER — EPHEDRINE SULFATE 50 MG/ML IJ SOLN
INTRAMUSCULAR | Status: DC | PRN
  Administered 2018-02-22 (×2): 10 mg via INTRAVENOUS

## 2018-02-22 MED ORDER — MIDAZOLAM HCL 2 MG/2ML IJ SOLN
INTRAMUSCULAR | Status: AC
  Filled 2018-02-22: qty 2

## 2018-02-22 MED ORDER — FENTANYL CITRATE (PF) 100 MCG/2ML IJ SOLN
INTRAMUSCULAR | Status: AC
  Administered 2018-02-22: 25 ug via INTRAVENOUS
  Filled 2018-02-22: qty 2

## 2018-02-22 MED ORDER — LIDOCAINE HCL (CARDIAC) PF 100 MG/5ML IV SOSY
PREFILLED_SYRINGE | INTRAVENOUS | Status: DC | PRN
  Administered 2018-02-22: 80 mg via INTRAVENOUS

## 2018-02-22 MED ORDER — LACTATED RINGERS IV SOLN
INTRAVENOUS | Status: DC
  Administered 2018-02-22: 09:00:00 via INTRAVENOUS

## 2018-02-22 MED ORDER — ONDANSETRON HCL 4 MG/2ML IJ SOLN
INTRAMUSCULAR | Status: DC | PRN
  Administered 2018-02-22: 4 mg via INTRAVENOUS

## 2018-02-22 MED ORDER — HYDROCODONE-ACETAMINOPHEN 5-325 MG PO TABS: 1.0000 | ORAL_TABLET | Freq: Four times a day (QID) | ORAL | 0 refills | Status: DC | PRN

## 2018-02-22 MED ORDER — SODIUM CHLORIDE FLUSH 0.9 % IV SOLN
INTRAVENOUS | Status: AC
  Filled 2018-02-22: qty 10

## 2018-02-22 MED ORDER — ONDANSETRON HCL 4 MG/2ML IJ SOLN
INTRAMUSCULAR | Status: AC
  Filled 2018-02-22: qty 2

## 2018-02-22 MED ORDER — FENTANYL CITRATE (PF) 100 MCG/2ML IJ SOLN
INTRAMUSCULAR | Status: DC | PRN
  Administered 2018-02-22: 25 ug via INTRAVENOUS
  Administered 2018-02-22: 50 ug via INTRAVENOUS
  Administered 2018-02-22: 25 ug via INTRAVENOUS

## 2018-02-22 MED ORDER — LIDOCAINE HCL (PF) 2 % IJ SOLN
INTRAMUSCULAR | Status: AC
  Filled 2018-02-22: qty 10

## 2018-02-22 MED ORDER — CHLORHEXIDINE GLUCONATE CLOTH 2 % EX PADS: 6.0000 | MEDICATED_PAD | Freq: Once | CUTANEOUS | Status: DC

## 2018-02-22 MED ORDER — DEXAMETHASONE SODIUM PHOSPHATE 10 MG/ML IJ SOLN
INTRAMUSCULAR | Status: DC | PRN
  Administered 2018-02-22: 10 mg via INTRAVENOUS

## 2018-02-22 MED ORDER — EPHEDRINE SULFATE 50 MG/ML IJ SOLN
INTRAMUSCULAR | Status: AC
  Filled 2018-02-22: qty 1

## 2018-02-22 MED ORDER — BUPIVACAINE-EPINEPHRINE 0.25% -1:200000 IJ SOLN
INTRAMUSCULAR | Status: DC | PRN
  Administered 2018-02-22: 10 mL

## 2018-02-22 MED ORDER — DEXAMETHASONE SODIUM PHOSPHATE 10 MG/ML IJ SOLN
INTRAMUSCULAR | Status: AC
  Filled 2018-02-22: qty 1

## 2018-02-22 MED ORDER — CEFAZOLIN SODIUM-DEXTROSE 2-4 GM/100ML-% IV SOLN
2.0000 g | INTRAVENOUS | Status: AC
  Administered 2018-02-22: 2 g via INTRAVENOUS

## 2018-02-22 MED ORDER — ONDANSETRON HCL 4 MG/2ML IJ SOLN: 4.0000 mg | Freq: Once | INTRAMUSCULAR | Status: DC | PRN

## 2018-02-22 MED ORDER — PHENYLEPHRINE HCL 10 MG/ML IJ SOLN
INTRAMUSCULAR | Status: DC | PRN
  Administered 2018-02-22 (×2): 50 ug via INTRAVENOUS
  Administered 2018-02-22 (×2): 100 ug via INTRAVENOUS

## 2018-02-22 MED ORDER — PHENYLEPHRINE HCL 10 MG/ML IJ SOLN
INTRAMUSCULAR | Status: AC
  Filled 2018-02-22: qty 1

## 2018-02-22 MED ORDER — FENTANYL CITRATE (PF) 100 MCG/2ML IJ SOLN
INTRAMUSCULAR | Status: AC
  Filled 2018-02-22: qty 2

## 2018-02-22 MED ORDER — FENTANYL CITRATE (PF) 100 MCG/2ML IJ SOLN
25.0000 ug | INTRAMUSCULAR | Status: DC | PRN
  Administered 2018-02-22 (×4): 25 ug via INTRAVENOUS

## 2018-02-22 SURGICAL SUPPLY — 31 items
APPLIER CLIP 9.375 SM OPEN (CLIP)
CANISTER SUCT 1200ML W/VALVE (MISCELLANEOUS) ×3 IMPLANT
CHLORAPREP W/TINT 26ML (MISCELLANEOUS) ×3 IMPLANT
CLIP APPLIE 9.375 SM OPEN (CLIP) IMPLANT
COVER PROBE FLX POLY STRL (MISCELLANEOUS) ×3 IMPLANT
COVER WAND RF STERILE (DRAPES) ×3 IMPLANT
DERMABOND ADVANCED (GAUZE/BANDAGES/DRESSINGS) ×2
DERMABOND ADVANCED .7 DNX12 (GAUZE/BANDAGES/DRESSINGS) ×1 IMPLANT
DRAPE CHEST BREAST 77X106 FENE (MISCELLANEOUS) ×3 IMPLANT
DRAPE INCISE IOBAN 66X45 STRL (DRAPES) ×3 IMPLANT
ELECT CAUTERY BLADE 6.4 (BLADE) ×3 IMPLANT
ELECT REM PT RETURN 9FT ADLT (ELECTROSURGICAL) ×3
ELECTRODE REM PT RTRN 9FT ADLT (ELECTROSURGICAL) ×1 IMPLANT
GLOVE BIO SURGEON STRL SZ7 (GLOVE) ×3 IMPLANT
GLOVE INDICATOR 7.5 STRL GRN (GLOVE) ×3 IMPLANT
GOWN STRL REUS W/ TWL LRG LVL3 (GOWN DISPOSABLE) ×2 IMPLANT
GOWN STRL REUS W/TWL LRG LVL3 (GOWN DISPOSABLE) ×4
KIT TURNOVER KIT A (KITS) ×3 IMPLANT
MARGIN MAP 10MM (MISCELLANEOUS) ×3 IMPLANT
NEEDLE HYPO 22GX1.5 SAFETY (NEEDLE) ×3 IMPLANT
PACK BASIN MINOR ARMC (MISCELLANEOUS) ×3 IMPLANT
SPONGE LAP 18X18 RF (DISPOSABLE) ×3 IMPLANT
SUT MNCRL 4-0 (SUTURE) ×4
SUT MNCRL 4-0 27XMFL (SUTURE) ×2
SUT SILK 2 0 SH (SUTURE) ×3 IMPLANT
SUT VIC AB 2-0 SH 27 (SUTURE) ×4
SUT VIC AB 2-0 SH 27XBRD (SUTURE) ×2 IMPLANT
SUT VIC AB 3-0 SH 27 (SUTURE) ×4
SUT VIC AB 3-0 SH 27X BRD (SUTURE) ×2 IMPLANT
SUTURE MNCRL 4-0 27XMF (SUTURE) ×2 IMPLANT
WATER STERILE IRR 1000ML POUR (IV SOLUTION) ×3 IMPLANT

## 2018-02-22 NOTE — Anesthesia Post-op Follow-up Note (Signed)
Anesthesia QCDR form completed.        

## 2018-02-22 NOTE — Transfer of Care (Signed)
Immediate Anesthesia Transfer of Care Note  Patient: Margaret Massey  Procedure(s) Performed: BREAST LUMPECTOMY WITH NEEDLE LOCALIZATION (Left )  Patient Location: PACU  Anesthesia Type:General  Level of Consciousness: awake and patient cooperative  Airway & Oxygen Therapy: Patient Spontanous Breathing and Patient connected to face mask oxygen  Post-op Assessment: Report given to RN and Post -op Vital signs reviewed and stable  Post vital signs: stable  Last Vitals:  Vitals Value Taken Time  BP 138/66 02/22/2018 10:44 AM  Temp 36.6 C 02/22/2018 10:44 AM  Pulse 80 02/22/2018 10:50 AM  Resp 13 02/22/2018 10:50 AM  SpO2 100 % 02/22/2018 10:50 AM  Vitals shown include unvalidated device data.  Last Pain:  Vitals:   02/22/18 1044  TempSrc:   PainSc: 5          Complications: No apparent anesthesia complications

## 2018-02-22 NOTE — Anesthesia Preprocedure Evaluation (Signed)
Anesthesia Evaluation  Patient identified by MRN, date of birth, ID band Patient awake    Reviewed: Allergy & Precautions, H&P , NPO status , Patient's Chart, lab work & pertinent test results, reviewed documented beta blocker date and time   History of Anesthesia Complications (+) PONV, Family history of anesthesia reaction and history of anesthetic complications  Airway Mallampati: II  TM Distance: >3 FB Neck ROM: full    Dental  (+) Teeth Intact   Pulmonary neg pulmonary ROS,    Pulmonary exam normal        Cardiovascular Exercise Tolerance: Good hypertension, Pt. on medications negative cardio ROS Normal cardiovascular exam Rate:Normal     Neuro/Psych PSYCHIATRIC DISORDERS Anxiety Depression negative neurological ROS  negative psych ROS   GI/Hepatic Neg liver ROS, GERD  Medicated,  Endo/Other  negative endocrine ROS  Renal/GU Renal disease  negative genitourinary   Musculoskeletal   Abdominal   Peds  Hematology negative hematology ROS (+)   Anesthesia Other Findings   Reproductive/Obstetrics negative OB ROS                             Anesthesia Physical Anesthesia Plan  ASA: II  Anesthesia Plan: General LMA   Post-op Pain Management:    Induction:   PONV Risk Score and Plan: 4 or greater  Airway Management Planned:   Additional Equipment:   Intra-op Plan:   Post-operative Plan:   Informed Consent: I have reviewed the patients History and Physical, chart, labs and discussed the procedure including the risks, benefits and alternatives for the proposed anesthesia with the patient or authorized representative who has indicated his/her understanding and acceptance.     Plan Discussed with: CRNA  Anesthesia Plan Comments:         Anesthesia Quick Evaluation

## 2018-02-22 NOTE — Interval H&P Note (Signed)
History and Physical Interval Note:  02/22/2018 7:42 AM  Margaret Massey  has presented today for surgery, with the diagnosis of breast mass  The various methods of treatment have been discussed with the patient and family. After consideration of risks, benefits and other options for treatment, the patient has consented to  Procedure(s): BREAST LUMPECTOMY WITH NEEDLE LOCALIZATION (Left) as a surgical intervention .  The patient's history has been reviewed, patient examined, no change in status, stable for surgery.  I have reviewed the patient's chart and labs.  Questions were answered to the patient's satisfaction.     [place]

## 2018-02-22 NOTE — Op Note (Signed)
  Pre-operative Diagnosis: Left breast nodule   Post-operative Diagnosis: Same   Surgeon:Sherial Ebrahim, MD FACS  Anesthesia: LMA  Procedure: Left Partial mastectomy, needle directed   Findings: Wire and clip within excised specimen by Xray  Estimated Blood Loss: Minimal         Drains: None         Specimens: partial mastectomy with labels       Complications: none             Condition: Stable      Procedure Details  The patient was seen again in the Holding Room. The benefits, complications, treatment options, and expected outcomes were discussed with the patient. The risks of bleeding, infection, recurrence of symptoms, failure to resolve symptoms, hematoma, seroma, open wound, cosmetic deformity, and the need for further surgery were discussed.  The patient was taken to Operating Room, identified as Margaret Massey and the procedure verified.  A Time Out was held and the above information confirmed.  Prior to the induction of general anesthesia, antibiotic prophylaxis was administered. VTE prophylaxis was in place. Appropriate anesthesia was then administered and tolerated well. The chest was prepped with Chloraprep and draped in the sterile fashion. The patient was positioned in the supine position.   Attention was turned to the needle localization site, ultrasound was used to locate the tip of the wire. An an incision was made centered at the tip of the wire. Dissection around the needle to perform a partial mastectomy with adequate margins was performed. This was done with electrocautery and sharp dissection. Hemostasis was with electrocautery. Additional Marcaine was infiltrated into the skin and subcutaneous tissues of the cavity. Once assuring that hemostasis was adequate and checked multiple times the wound was closed with interrupted 2-0 Vicryl followed by 4-0 subcuticular Monocryl sutures. Dermabond was placed  Patient was taken to the recovery room in stable condition  where a postoperative chest film has been ordered.   Caroleen Hamman, MD, FACS

## 2018-02-22 NOTE — Anesthesia Procedure Notes (Signed)
Procedure Name: LMA Insertion Date/Time: 02/22/2018 9:53 AM Performed by: Lavone Orn, CRNA Pre-anesthesia Checklist: Patient identified, Emergency Drugs available, Suction available, Patient being monitored and Timeout performed Patient Re-evaluated:Patient Re-evaluated prior to induction Oxygen Delivery Method: Circle system utilized Preoxygenation: Pre-oxygenation with 100% oxygen Induction Type: IV induction Ventilation: Mask ventilation without difficulty LMA: LMA inserted LMA Size: 4.0 Tube secured with: Tape Dental Injury: Teeth and Oropharynx as per pre-operative assessment

## 2018-02-23 ENCOUNTER — Encounter: Payer: Self-pay | Admitting: Surgery

## 2018-02-23 LAB — SURGICAL PATHOLOGY

## 2018-02-26 NOTE — Anesthesia Postprocedure Evaluation (Signed)
Anesthesia Post Note  Patient: Margaret Massey  Procedure(s) Performed: BREAST LUMPECTOMY WITH NEEDLE LOCALIZATION (Left )  Patient location during evaluation: PACU Anesthesia Type: General Level of consciousness: awake and alert Pain management: pain level controlled Vital Signs Assessment: post-procedure vital signs reviewed and stable Respiratory status: spontaneous breathing, nonlabored ventilation, respiratory function stable and patient connected to nasal cannula oxygen Cardiovascular status: blood pressure returned to baseline and stable Postop Assessment: no apparent nausea or vomiting Anesthetic complications: no     Last Vitals:  Vitals:   02/22/18 1129 02/22/18 1140  BP: 130/70 (!) 134/48  Pulse: 84 80  Resp: 13 14  Temp: 36.8 C (!) 36.3 C  SpO2: 96% 94%    Last Pain:  Vitals:   02/22/18 1140  TempSrc: Temporal  PainSc: 0-No pain                 Molli Barrows

## 2018-02-28 ENCOUNTER — Telehealth: Payer: Self-pay | Admitting: Surgery

## 2018-02-28 NOTE — Telephone Encounter (Signed)
D/w the pt about pathology. She is very Patent attorney. She is otherwise doing well. F/U this Monday

## 2018-03-05 ENCOUNTER — Other Ambulatory Visit: Payer: Self-pay

## 2018-03-05 ENCOUNTER — Encounter: Payer: Self-pay | Admitting: Surgery

## 2018-03-05 ENCOUNTER — Ambulatory Visit (INDEPENDENT_AMBULATORY_CARE_PROVIDER_SITE_OTHER): Payer: Medicare Other | Admitting: Surgery

## 2018-03-05 VITALS — BP 142/85 | HR 76 | Temp 97.7°F | Resp 16 | Ht 63.5 in | Wt 160.4 lb

## 2018-03-05 DIAGNOSIS — Z09 Encounter for follow-up examination after completed treatment for conditions other than malignant neoplasm: Secondary | ICD-10-CM

## 2018-03-05 NOTE — Patient Instructions (Signed)
Please call our office if you have questions or concerns.   

## 2018-03-07 ENCOUNTER — Encounter: Payer: Self-pay | Admitting: Surgery

## 2018-03-07 NOTE — Progress Notes (Signed)
S/p lumpectomy Doing very well w/o complaints Path d/w the pt in detail usual ductal hyperplasia no atypia or malignancy  PE NAD Wound healing well, no infection.  A/P Doing well, no complications RTC prn

## 2019-01-14 ENCOUNTER — Other Ambulatory Visit: Payer: Self-pay | Admitting: Family Medicine

## 2019-01-14 DIAGNOSIS — E2839 Other primary ovarian failure: Secondary | ICD-10-CM

## 2019-01-14 DIAGNOSIS — Z1231 Encounter for screening mammogram for malignant neoplasm of breast: Secondary | ICD-10-CM

## 2019-04-02 ENCOUNTER — Ambulatory Visit: Payer: Medicare Other | Attending: Internal Medicine

## 2019-04-02 DIAGNOSIS — Z23 Encounter for immunization: Secondary | ICD-10-CM

## 2019-04-02 NOTE — Progress Notes (Signed)
   Covid-19 Vaccination Clinic  Name:  Margaret Massey    MRN: [id number] DOB: Oct 01, 1950  04/02/2019  Ms. Nails was observed post Covid-19 immunization for 15 minutes without incidence. She was provided with Vaccine Information Sheet and instruction to access the V-Safe system.   [name] was instructed to call 911 with any severe reactions post vaccine: Marland Kitchen Difficulty breathing  . Swelling of your face and throat  . A fast heartbeat  . A bad rash all over your body  . Dizziness and weakness    Immunizations Administered    Name Date Dose VIS Date Route   Pfizer COVID-19 Vaccine 04/02/2019  5:39 PM 0.3 mL 02/22/2019 Intramuscular   Manufacturer: [employer], [employer]   Lot: [id number]   [place]: [id number]

## 2019-04-20 ENCOUNTER — Ambulatory Visit: Payer: Medicare Other | Attending: Internal Medicine

## 2019-04-20 DIAGNOSIS — Z23 Encounter for immunization: Secondary | ICD-10-CM

## 2019-04-20 NOTE — Progress Notes (Signed)
   Covid-19 Vaccination Clinic  Name:  Margaret Massey    MRN: [id number] DOB: 06/30/1950  04/20/2019  [name] was observed post Covid-19 immunization for 15 minutes without incidence. She was provided with Vaccine Information Sheet and instruction to access the V-Safe system.   [name] was instructed to call 911 with any severe reactions post vaccine: Marland Kitchen Difficulty breathing  . Swelling of your face and throat  . A fast heartbeat  . A bad rash all over your body  . Dizziness and weakness    Immunizations Administered    Name Date Dose VIS Date Route   Pfizer COVID-19 Vaccine 04/20/2019 11:00 AM 0.3 mL 02/22/2019 Intramuscular   Manufacturer: [place]   Lot: [id number]   [place]: [id number]

## 2019-07-16 ENCOUNTER — Ambulatory Visit
Admission: RE | Admit: 2019-07-16 | Discharge: 2019-07-16 | Disposition: A | Discharge status: Home / Self Care | Payer: Medicare Other | Source: Ambulatory Visit | Attending: Family Medicine | Admitting: Family Medicine

## 2019-07-16 DIAGNOSIS — Z1231 Encounter for screening mammogram for malignant neoplasm of breast: Secondary | ICD-10-CM | POA: Diagnosis present

## 2019-07-16 DIAGNOSIS — E2839 Other primary ovarian failure: Secondary | ICD-10-CM

## 2019-07-17 ENCOUNTER — Other Ambulatory Visit: Payer: Self-pay | Admitting: Family Medicine

## 2019-07-17 ENCOUNTER — Other Ambulatory Visit: Payer: Self-pay | Admitting: Internal Medicine

## 2019-07-18 ENCOUNTER — Other Ambulatory Visit: Payer: Self-pay | Admitting: Family Medicine

## 2019-07-18 DIAGNOSIS — R928 Other abnormal and inconclusive findings on diagnostic imaging of breast: Secondary | ICD-10-CM

## 2019-07-22 ENCOUNTER — Other Ambulatory Visit: Payer: Self-pay | Admitting: Family Medicine

## 2019-07-22 DIAGNOSIS — R928 Other abnormal and inconclusive findings on diagnostic imaging of breast: Secondary | ICD-10-CM

## 2019-07-22 DIAGNOSIS — N6310 Unspecified lump in the right breast, unspecified quadrant: Secondary | ICD-10-CM

## 2019-07-29 ENCOUNTER — Ambulatory Visit
Admission: RE | Admit: 2019-07-29 | Discharge: 2019-07-29 | Disposition: A | Discharge status: Home / Self Care | Payer: Medicare Other | Source: Ambulatory Visit | Attending: Family Medicine | Admitting: Family Medicine

## 2019-07-29 DIAGNOSIS — R928 Other abnormal and inconclusive findings on diagnostic imaging of breast: Secondary | ICD-10-CM

## 2019-07-29 DIAGNOSIS — N6310 Unspecified lump in the right breast, unspecified quadrant: Secondary | ICD-10-CM

## 2019-08-02 ENCOUNTER — Other Ambulatory Visit: Payer: Self-pay | Admitting: Family Medicine

## 2019-08-02 DIAGNOSIS — N6310 Unspecified lump in the right breast, unspecified quadrant: Secondary | ICD-10-CM

## 2019-11-08 ENCOUNTER — Ambulatory Visit: Payer: Medicare Other | Attending: Internal Medicine

## 2019-11-08 DIAGNOSIS — Z23 Encounter for immunization: Secondary | ICD-10-CM

## 2019-11-08 NOTE — Progress Notes (Signed)
   Covid-19 Vaccination Clinic  Name:  SAMYAH BILBO    MRN: [id number] DOB: 09/25/1950  11/08/2019  Ms. Angus was observed post Covid-19 immunization for 15 minutes without incident. She was provided with Vaccine Information Sheet and instruction to access the V-Safe system.   Ms. Vanzandt was instructed to call 911 with any severe reactions post vaccine: Marland Kitchen Difficulty breathing  . Swelling of face and throat  . A fast heartbeat  . A bad rash all over body  . Dizziness and weakness

## 2020-01-21 ENCOUNTER — Other Ambulatory Visit
Admission: RE | Admit: 2020-01-21 | Discharge: 2020-01-21 | Disposition: A | Discharge status: Home / Self Care | Payer: Medicare Other | Source: Ambulatory Visit | Attending: Orthopedic Surgery | Admitting: Orthopedic Surgery

## 2020-01-21 ENCOUNTER — Other Ambulatory Visit: Payer: Self-pay | Admitting: Orthopedic Surgery

## 2020-01-21 ENCOUNTER — Other Ambulatory Visit: Payer: Self-pay

## 2020-01-21 DIAGNOSIS — Z20822 Contact with and (suspected) exposure to covid-19: Secondary | ICD-10-CM | POA: Insufficient documentation

## 2020-01-21 DIAGNOSIS — I10 Essential (primary) hypertension: Secondary | ICD-10-CM | POA: Insufficient documentation

## 2020-01-21 DIAGNOSIS — Z01818 Encounter for other preprocedural examination: Secondary | ICD-10-CM | POA: Insufficient documentation

## 2020-01-21 LAB — SARS CORONAVIRUS 2 (TAT 6-24 HRS): SARS Coronavirus 2: NEGATIVE

## 2020-01-21 NOTE — Patient Instructions (Addendum)
Your procedure is scheduled on: Thursday, November 11 Report to the Registration Desk on the 1st floor of the [facility]. To find out your arrival time, please call [phone] between 1PM - 3PM on: Wednesday, November 10  REMEMBER: Instructions that are not followed completely may result in serious medical risk, up to and including death; or upon the discretion of your surgeon and anesthesiologist your surgery may need to be rescheduled.  Do not eat food after midnight the night before surgery.  No gum chewing, lozengers or hard candies.  You may however, drink CLEAR liquids up to 2 hours before you are scheduled to arrive for your surgery. Do not drink anything within 2 hours of your scheduled arrival time.  Clear liquids include: - water  - apple juice without pulp - gatorade (not RED, PURPLE, OR BLUE) - black coffee or tea (Do NOT add milk or creamers to the coffee or tea) Do NOT drink anything that is not on this list.  TAKE THESE MEDICATIONS THE MORNING OF SURGERY WITH A SIP OF WATER:  1.  Omeprazole - (take one the night before and one on the morning of surgery - helps to prevent nausea after surgery.)  One week prior to surgery: Stop aspirin and Anti-inflammatories (NSAIDS) such as Advil, Aleve, Ibuprofen, Motrin, Naproxen, Naprosyn and Aspirin based products such as Excedrin, Goodys Powder, BC Powder. Stop ANY OVER THE COUNTER supplements until after surgery. (Fish oil) (However, you may continue taking Vitamin D up until the day before surgery.)  No Alcohol for 24 hours before or after surgery.  On the morning of surgery brush your teeth with toothpaste and water, you may rinse your mouth with mouthwash if you wish. Do not swallow any toothpaste or mouthwash.  Do not wear jewelry, make-up, hairpins, clips or nail polish.  Do not wear lotions, powders, or perfumes.   Do not shave 48 hours prior to surgery.   Contact lenses, hearing aids and dentures may not be  worn into surgery.  Do not bring valuables to the hospital. [facility] is not responsible for any missing/lost belongings or valuables.   Use CHG Soap as directed on instruction sheet.  Notify your doctor if there is any change in your medical condition (cold, fever, infection).  Wear comfortable clothing (specific to your surgery type) to the hospital.  Plan for stool softeners for home use; pain medications have a tendency to cause constipation. You can also help prevent constipation by eating foods high in fiber such as fruits and vegetables and drinking plenty of fluids as your diet allows.  After surgery, you can help prevent lung complications by doing breathing exercises.  Take deep breaths and cough every 1-2 hours. Your doctor may order a device called an Incentive Spirometer to help you take deep breaths.  If you are being admitted to the hospital overnight, leave your suitcase in the car. After surgery it may be brought to your room.  If you are being discharged the day of surgery, you will not be allowed to drive home. You will need a responsible adult (18 years or older) to drive you home and stay with you that night.   If you are taking public transportation, you will need to have a responsible adult (18 years or older) with you. Please confirm with your physician that it is acceptable to use public transportation.   Please call the [place] Dept. at [phone] if you have any questions about these instructions.  Visitation Policy:  Patients undergoing a surgery or procedure may have one family member or support person with them as long as that person is not COVID-19 positive or experiencing its symptoms.  That person may remain in the waiting area during the procedure.

## 2020-01-22 ENCOUNTER — Encounter
Admission: RE | Admit: 2020-01-22 | Discharge: 2020-01-22 | Disposition: A | Discharge status: Home / Self Care | Payer: Medicare Other | Source: Ambulatory Visit | Attending: Orthopedic Surgery | Admitting: Orthopedic Surgery

## 2020-01-22 ENCOUNTER — Inpatient Hospital Stay: Admission: RE | Admit: 2020-01-22 | Payer: Medicare Other | Source: Ambulatory Visit

## 2020-01-22 ENCOUNTER — Other Ambulatory Visit: Payer: Medicare Other

## 2020-01-22 DIAGNOSIS — Z01818 Encounter for other preprocedural examination: Secondary | ICD-10-CM | POA: Diagnosis not present

## 2020-01-22 DIAGNOSIS — I10 Essential (primary) hypertension: Secondary | ICD-10-CM | POA: Diagnosis not present

## 2020-01-22 LAB — BASIC METABOLIC PANEL
Anion gap: 6 (ref 5–15)
BUN: 24 mg/dL — ABNORMAL HIGH (ref 8–23)
CO2: 25 mmol/L (ref 22–32)
Calcium: 8.9 mg/dL (ref 8.9–10.3)
Chloride: 108 mmol/L (ref 98–111)
Creatinine, Ser: 1.11 mg/dL — ABNORMAL HIGH (ref 0.44–1.00)
GFR, Estimated: 53 mL/min — ABNORMAL LOW (ref >60)
Glucose, Bld: 101 mg/dL — ABNORMAL HIGH (ref 70–99)
Potassium: 4.3 mmol/L (ref 3.5–5.1)
Sodium: 139 mmol/L (ref 135–145)

## 2020-01-22 LAB — CBC WITH DIFFERENTIAL/PLATELET
Abs Immature Granulocytes: 0.03 10*3/uL (ref 0.00–0.07)
Basophils Absolute: 0.1 10*3/uL (ref 0.0–0.1)
Basophils Relative: 1 %
Eosinophils Absolute: 0.2 10*3/uL (ref 0.0–0.5)
Eosinophils Relative: 3 %
HCT: 38.6 % (ref 36.0–46.0)
Hemoglobin: 12.7 g/dL (ref 12.0–15.0)
Immature Granulocytes: 0 %
Lymphocytes Relative: 24 %
Lymphs Abs: 1.6 10*3/uL (ref 0.7–4.0)
MCH: 29.7 pg (ref 26.0–34.0)
MCHC: 32.9 g/dL (ref 30.0–36.0)
MCV: 90.2 fL (ref 80.0–100.0)
Monocytes Absolute: 0.5 10*3/uL (ref 0.1–1.0)
Monocytes Relative: 7 %
Neutro Abs: 4.4 10*3/uL (ref 1.7–7.7)
Neutrophils Relative %: 65 %
Platelets: 240 10*3/uL (ref 150–400)
RBC: 4.28 MIL/uL (ref 3.87–5.11)
RDW: 13.2 % (ref 11.5–15.5)
WBC: 6.7 10*3/uL (ref 4.0–10.5)
nRBC: 0 % (ref 0.0–0.2)

## 2020-01-22 LAB — PROTIME-INR
INR: 0.9 (ref 0.8–1.2)
Prothrombin Time: 11.9 seconds (ref 11.4–15.2)

## 2020-01-22 LAB — APTT: aPTT: 29 seconds (ref 24–36)

## 2020-01-23 ENCOUNTER — Ambulatory Visit: Payer: Medicare Other

## 2020-01-23 ENCOUNTER — Ambulatory Visit: Payer: Medicare Other | Admitting: Certified Registered"

## 2020-01-23 ENCOUNTER — Ambulatory Visit
Admission: RE | Admit: 2020-01-23 | Discharge: 2020-01-23 | Disposition: A | Discharge status: Home / Self Care | Payer: Medicare Other | Attending: Orthopedic Surgery | Admitting: Orthopedic Surgery

## 2020-01-23 ENCOUNTER — Other Ambulatory Visit: Payer: Self-pay

## 2020-01-23 ENCOUNTER — Encounter: Payer: Self-pay | Admitting: Orthopedic Surgery

## 2020-01-23 ENCOUNTER — Encounter
Admission: RE | Disposition: A | Discharge status: Home / Self Care | Payer: Self-pay | Source: Home / Self Care | Attending: Orthopedic Surgery

## 2020-01-23 DIAGNOSIS — M75122 Complete rotator cuff tear or rupture of left shoulder, not specified as traumatic: Secondary | ICD-10-CM | POA: Diagnosis present

## 2020-01-23 DIAGNOSIS — M25812 Other specified joint disorders, left shoulder: Secondary | ICD-10-CM | POA: Diagnosis not present

## 2020-01-23 DIAGNOSIS — Z7982 Long term (current) use of aspirin: Secondary | ICD-10-CM | POA: Diagnosis not present

## 2020-01-23 DIAGNOSIS — M19012 Primary osteoarthritis, left shoulder: Secondary | ICD-10-CM | POA: Diagnosis not present

## 2020-01-23 DIAGNOSIS — Z905 Acquired absence of kidney: Secondary | ICD-10-CM | POA: Diagnosis not present

## 2020-01-23 DIAGNOSIS — Z79899 Other long term (current) drug therapy: Secondary | ICD-10-CM | POA: Diagnosis not present

## 2020-01-23 DIAGNOSIS — Z85528 Personal history of other malignant neoplasm of kidney: Secondary | ICD-10-CM | POA: Insufficient documentation

## 2020-01-23 DIAGNOSIS — Z419 Encounter for procedure for purposes other than remedying health state, unspecified: Secondary | ICD-10-CM

## 2020-01-23 HISTORY — PX: SHOULDER ARTHROSCOPY WITH OPEN ROTATOR CUFF REPAIR AND DISTAL CLAVICLE ACROMINECTOMY: SHX5683

## 2020-01-23 SURGERY — SHOULDER ARTHROSCOPY WITH OPEN ROTATOR CUFF REPAIR AND DISTAL CLAVICLE ACROMINECTOMY
Anesthesia: General | Laterality: Left

## 2020-01-23 MED ORDER — FENTANYL CITRATE (PF) 100 MCG/2ML IJ SOLN: 50.0000 ug | Freq: Once | INTRAMUSCULAR | Status: DC

## 2020-01-23 MED ORDER — DEXAMETHASONE SODIUM PHOSPHATE 10 MG/ML IJ SOLN
INTRAMUSCULAR | Status: AC
  Filled 2020-01-23: qty 1

## 2020-01-23 MED ORDER — PROMETHAZINE HCL 25 MG/ML IJ SOLN: 6.2500 mg | INTRAMUSCULAR | Status: DC | PRN

## 2020-01-23 MED ORDER — FENTANYL CITRATE (PF) 100 MCG/2ML IJ SOLN
INTRAMUSCULAR | Status: DC | PRN
  Administered 2020-01-23 (×4): 25 ug via INTRAVENOUS

## 2020-01-23 MED ORDER — ONDANSETRON HCL 4 MG/2ML IJ SOLN
INTRAMUSCULAR | Status: AC
  Filled 2020-01-23: qty 2

## 2020-01-23 MED ORDER — ORAL CARE MOUTH RINSE: 15.0000 mL | Freq: Once | OROMUCOSAL | Status: AC

## 2020-01-23 MED ORDER — CHLORHEXIDINE GLUCONATE CLOTH 2 % EX PADS: 6.0000 | MEDICATED_PAD | Freq: Once | CUTANEOUS | Status: DC

## 2020-01-23 MED ORDER — SCOPOLAMINE 1 MG/3DAYS TD PT72
MEDICATED_PATCH | TRANSDERMAL | Status: AC
  Administered 2020-01-23: 1.5 mg via TRANSDERMAL
  Filled 2020-01-23: qty 1

## 2020-01-23 MED ORDER — EPINEPHRINE PF 1 MG/ML IJ SOLN
INTRAMUSCULAR | Status: DC | PRN
  Administered 2020-01-23: 4 mL

## 2020-01-23 MED ORDER — BUPIVACAINE HCL (PF) 0.5 % IJ SOLN
INTRAMUSCULAR | Status: AC
  Filled 2020-01-23: qty 10

## 2020-01-23 MED ORDER — PROPOFOL 10 MG/ML IV BOLUS
INTRAVENOUS | Status: DC | PRN
  Administered 2020-01-23: 130 mg via INTRAVENOUS

## 2020-01-23 MED ORDER — ROCURONIUM BROMIDE 10 MG/ML (PF) SYRINGE
PREFILLED_SYRINGE | INTRAVENOUS | Status: AC
  Filled 2020-01-23: qty 10

## 2020-01-23 MED ORDER — DEXMEDETOMIDINE (PRECEDEX) IN NS 20 MCG/5ML (4 MCG/ML) IV SYRINGE
PREFILLED_SYRINGE | INTRAVENOUS | Status: AC
  Filled 2020-01-23: qty 5

## 2020-01-23 MED ORDER — EPHEDRINE SULFATE 50 MG/ML IJ SOLN
INTRAMUSCULAR | Status: DC | PRN
  Administered 2020-01-23 (×2): 10 mg via INTRAVENOUS

## 2020-01-23 MED ORDER — BUPIVACAINE HCL (PF) 0.5 % IJ SOLN
INTRAMUSCULAR | Status: DC | PRN
  Administered 2020-01-23 (×2): 5 mL

## 2020-01-23 MED ORDER — ROCURONIUM BROMIDE 100 MG/10ML IV SOLN
INTRAVENOUS | Status: DC | PRN
  Administered 2020-01-23: 50 mg via INTRAVENOUS
  Administered 2020-01-23: 20 mg via INTRAVENOUS

## 2020-01-23 MED ORDER — ONDANSETRON HCL 4 MG PO TABS: 4.0000 mg | ORAL_TABLET | Freq: Three times a day (TID) | ORAL | 0 refills | Status: DC | PRN

## 2020-01-23 MED ORDER — OXYCODONE HCL 5 MG PO TABS
5.0000 mg | ORAL_TABLET | ORAL | 0 refills | Status: DC | PRN
Start: 2020-01-23 — End: 2020-04-30

## 2020-01-23 MED ORDER — CEFAZOLIN SODIUM-DEXTROSE 2-4 GM/100ML-% IV SOLN
INTRAVENOUS | Status: AC
  Filled 2020-01-23: qty 100

## 2020-01-23 MED ORDER — LIDOCAINE HCL (PF) 1 % IJ SOLN
INTRAMUSCULAR | Status: DC | PRN
  Administered 2020-01-23: 2 mL

## 2020-01-23 MED ORDER — FENTANYL CITRATE (PF) 100 MCG/2ML IJ SOLN
INTRAMUSCULAR | Status: AC
  Filled 2020-01-23: qty 2

## 2020-01-23 MED ORDER — DEXMEDETOMIDINE (PRECEDEX) IN NS 20 MCG/5ML (4 MCG/ML) IV SYRINGE
PREFILLED_SYRINGE | INTRAVENOUS | Status: DC | PRN
  Administered 2020-01-23: 4 ug via INTRAVENOUS

## 2020-01-23 MED ORDER — FENTANYL CITRATE (PF) 100 MCG/2ML IJ SOLN: 25.0000 ug | Freq: Once | INTRAMUSCULAR | Status: AC

## 2020-01-23 MED ORDER — DEXAMETHASONE SODIUM PHOSPHATE 10 MG/ML IJ SOLN
INTRAMUSCULAR | Status: DC | PRN
  Administered 2020-01-23: 10 mg via INTRAVENOUS

## 2020-01-23 MED ORDER — SUGAMMADEX SODIUM 500 MG/5ML IV SOLN
INTRAVENOUS | Status: DC | PRN
  Administered 2020-01-23: 140 mg via INTRAVENOUS

## 2020-01-23 MED ORDER — SODIUM CHLORIDE 0.9 % IV SOLN
INTRAVENOUS | Status: DC | PRN
  Administered 2020-01-23: 30 ug/min via INTRAVENOUS

## 2020-01-23 MED ORDER — BUPIVACAINE LIPOSOME 1.3 % IJ SUSP
INTRAMUSCULAR | Status: DC | PRN
  Administered 2020-01-23: 10 mL
  Administered 2020-01-23 (×2): 5 mL

## 2020-01-23 MED ORDER — ACETAMINOPHEN 500 MG PO TABS
ORAL_TABLET | ORAL | Status: AC
  Administered 2020-01-23: 1000 mg via ORAL
  Filled 2020-01-23: qty 2

## 2020-01-23 MED ORDER — CEFAZOLIN SODIUM-DEXTROSE 2-4 GM/100ML-% IV SOLN
2.0000 g | INTRAVENOUS | Status: AC
  Administered 2020-01-23: 2 g via INTRAVENOUS

## 2020-01-23 MED ORDER — LACTATED RINGERS IV SOLN: INTRAVENOUS | Status: DC

## 2020-01-23 MED ORDER — LIDOCAINE HCL (PF) 1 % IJ SOLN
INTRAMUSCULAR | Status: AC
  Filled 2020-01-23: qty 5

## 2020-01-23 MED ORDER — LIDOCAINE HCL (CARDIAC) PF 100 MG/5ML IV SOSY
PREFILLED_SYRINGE | INTRAVENOUS | Status: DC | PRN
  Administered 2020-01-23: 60 mg via INTRAVENOUS

## 2020-01-23 MED ORDER — ACETAMINOPHEN 500 MG PO TABS: 1000.0000 mg | ORAL_TABLET | ORAL | Status: AC

## 2020-01-23 MED ORDER — FENTANYL CITRATE (PF) 100 MCG/2ML IJ SOLN: 25.0000 ug | Freq: Once | INTRAMUSCULAR | Status: DC

## 2020-01-23 MED ORDER — ONDANSETRON HCL 4 MG/2ML IJ SOLN
INTRAMUSCULAR | Status: DC | PRN
  Administered 2020-01-23: 4 mg via INTRAVENOUS

## 2020-01-23 MED ORDER — SCOPOLAMINE 1 MG/3DAYS TD PT72: 1.0000 | MEDICATED_PATCH | TRANSDERMAL | Status: DC

## 2020-01-23 MED ORDER — BUPIVACAINE LIPOSOME 1.3 % IJ SUSP
INTRAMUSCULAR | Status: AC
  Filled 2020-01-23: qty 20

## 2020-01-23 MED ORDER — CHLORHEXIDINE GLUCONATE 0.12 % MT SOLN: 15.0000 mL | Freq: Once | OROMUCOSAL | Status: AC

## 2020-01-23 MED ORDER — LIDOCAINE HCL (PF) 2 % IJ SOLN
INTRAMUSCULAR | Status: AC
  Filled 2020-01-23: qty 5

## 2020-01-23 MED ORDER — FENTANYL CITRATE (PF) 100 MCG/2ML IJ SOLN: 25.0000 ug | INTRAMUSCULAR | Status: DC | PRN

## 2020-01-23 MED ORDER — FENTANYL CITRATE (PF) 100 MCG/2ML IJ SOLN
INTRAMUSCULAR | Status: AC
  Administered 2020-01-23: 25 ug via INTRAVENOUS
  Filled 2020-01-23: qty 2

## 2020-01-23 MED ORDER — CHLORHEXIDINE GLUCONATE 0.12 % MT SOLN
OROMUCOSAL | Status: AC
  Administered 2020-01-23: 15 mL via OROMUCOSAL
  Filled 2020-01-23: qty 15

## 2020-01-23 SURGICAL SUPPLY — 74 items
ADAPTER IRRIG TUBE 2 SPIKE SOL (ADAPTER) ×4 IMPLANT
ADPR TBG 2 SPK PMP STRL ASCP (ADAPTER) ×2
ANCH SUT 5.5 KNTLS PEEK (Orthopedic Implant) ×3 IMPLANT
ANCH SUT Q-FX 2.8 (Anchor) ×2 IMPLANT
ANCHOR ALL-SUT Q-FIX 2.8 (Anchor) ×4 IMPLANT
ANCHOR SUT 5.5 MULTIFIX (Orthopedic Implant) ×6 IMPLANT
BUR RADIUS 4.0X18.5 (BURR) ×2 IMPLANT
BUR RADIUS 5.5 (BURR) ×2 IMPLANT
CANISTER SUCT LVC 12 LTR MEDI- (MISCELLANEOUS) ×2 IMPLANT
CANNULA 5.75X7 CRYSTAL CLEAR (CANNULA) ×2 IMPLANT
CANNULA PARTIAL THREAD 2X7 (CANNULA) ×2 IMPLANT
CANNULA TWIST IN 8.25X9CM (CANNULA) ×4 IMPLANT
CONNECTOR PERFECT PASSER (CONNECTOR) ×2 IMPLANT
COOLER POLAR GLACIER W/PUMP (MISCELLANEOUS) ×2 IMPLANT
COVER WAND RF STERILE (DRAPES) ×2 IMPLANT
DEVICE SUCT BLK HOLE OR FLOOR (MISCELLANEOUS) ×4 IMPLANT
DRAPE 3/4 80X56 (DRAPES) ×2 IMPLANT
DRAPE IMP U-DRAPE 54X76 (DRAPES) ×4 IMPLANT
DRAPE INCISE IOBAN 66X45 STRL (DRAPES) ×2 IMPLANT
DRAPE U-SHAPE 47X51 STRL (DRAPES) ×2 IMPLANT
DURAPREP 26ML APPLICATOR (WOUND CARE) ×6 IMPLANT
ELECT REM PT RETURN 9FT ADLT (ELECTROSURGICAL) ×2
ELECTRODE REM PT RTRN 9FT ADLT (ELECTROSURGICAL) ×1 IMPLANT
GAUZE SPONGE 4X4 12PLY STRL (GAUZE/BANDAGES/DRESSINGS) ×2 IMPLANT
GAUZE XEROFORM 1X8 LF (GAUZE/BANDAGES/DRESSINGS) ×2 IMPLANT
GLOVE BIOGEL PI IND STRL 9 (GLOVE) ×1 IMPLANT
GLOVE BIOGEL PI INDICATOR 9 (GLOVE) ×1
GLOVE SURG 9.0 ORTHO LTXF (GLOVE) ×6 IMPLANT
GOWN STRL REUS TWL 2XL XL LVL4 (GOWN DISPOSABLE) ×2 IMPLANT
GOWN STRL REUS W/ TWL LRG LVL3 (GOWN DISPOSABLE) ×1 IMPLANT
GOWN STRL REUS W/TWL LRG LVL3 (GOWN DISPOSABLE) ×2
IV LACTATED RINGER IRRG 3000ML (IV SOLUTION) ×20
IV LR IRRIG 3000ML ARTHROMATIC (IV SOLUTION) ×10 IMPLANT
KIT STABILIZATION SHOULDER (MISCELLANEOUS) ×2 IMPLANT
KIT SUTURE 2.8 Q-FIX DISP (MISCELLANEOUS) ×2 IMPLANT
KIT SUTURETAK 3.0 INSERT PERC (KITS) IMPLANT
KIT TURNOVER KIT A (KITS) ×2 IMPLANT
MANIFOLD NEPTUNE II (INSTRUMENTS) ×4 IMPLANT
MASK FACE SPIDER DISP (MASK) ×2 IMPLANT
MAT ABSORB  FLUID 56X50 GRAY (MISCELLANEOUS) ×6
MAT ABSORB FLUID 56X50 GRAY (MISCELLANEOUS) ×3 IMPLANT
NDL SAFETY ECLIPSE 18X1.5 (NEEDLE) ×1 IMPLANT
NEEDLE HYPO 18GX1.5 SHARP (NEEDLE) ×2
NEEDLE HYPO 22GX1.5 SAFETY (NEEDLE) ×2 IMPLANT
NS IRRIG 500ML POUR BTL (IV SOLUTION) ×2 IMPLANT
PACK ARTHROSCOPY SHOULDER (MISCELLANEOUS) ×2 IMPLANT
PAD ARMBOARD 7.5X6 YLW CONV (MISCELLANEOUS) ×4 IMPLANT
PAD WRAPON POLAR SHDR XLG (MISCELLANEOUS) ×1 IMPLANT
PASSER SUT CAPTURE FIRST (INSTRUMENTS) ×2 IMPLANT
PASSER SUT FIRSTPASS SELF (INSTRUMENTS) ×4 IMPLANT
SET TUBE SUCT SHAVER OUTFL 24K (TUBING) ×2 IMPLANT
SET TUBE TIP INTRA-ARTICULAR (MISCELLANEOUS) ×2 IMPLANT
STRIP CLOSURE SKIN 1/2X4 (GAUZE/BANDAGES/DRESSINGS) ×2 IMPLANT
SUT ETHILON 4-0 (SUTURE) ×2
SUT ETHILON 4-0 FS2 18XMFL BLK (SUTURE) ×1
SUT LASSO 90 DEG SD STR (SUTURE) IMPLANT
SUT MNCRL 4-0 (SUTURE) ×2
SUT MNCRL 4-0 27XMFL (SUTURE) ×1
SUT PDS AB 0 CT1 27 (SUTURE) ×6 IMPLANT
SUT PERFECTPASSER WHITE CART (SUTURE) ×4 IMPLANT
SUT SMART STITCH CARTRIDGE (SUTURE) ×6 IMPLANT
SUT ULTRABRAID 2 COBRAID 38 (SUTURE) IMPLANT
SUT VIC AB 0 CT1 36 (SUTURE) ×6 IMPLANT
SUT VIC AB 2-0 CT2 27 (SUTURE) ×2 IMPLANT
SUTURE ETHLN 4-0 FS2 18XMF BLK (SUTURE) ×1 IMPLANT
SUTURE MAGNUM WIRE 2X48 BLK (SUTURE) IMPLANT
SUTURE MNCRL 4-0 27XMF (SUTURE) ×1 IMPLANT
SYR 10ML LL (SYRINGE) ×2 IMPLANT
TAPE MICROFOAM 4IN (TAPE) ×2 IMPLANT
TUBING ARTHRO INFLOW-ONLY STRL (TUBING) ×2 IMPLANT
TUBING CONNECTING 10 (TUBING) ×2 IMPLANT
WAND HAND CNTRL MULTIVAC 90 (MISCELLANEOUS) ×2 IMPLANT
WIRE MAGNUM (SUTURE) ×2 IMPLANT
WRAPON POLAR PAD SHDR XLG (MISCELLANEOUS) ×2

## 2020-01-23 NOTE — Anesthesia Procedure Notes (Signed)
Procedure Name: Intubation Date/Time: 01/23/2020 10:03 AM Performed by: Jerrye Noble, CRNA Pre-anesthesia Checklist: Patient identified, Emergency Drugs available, Suction available and Patient being monitored Patient Re-evaluated:Patient Re-evaluated prior to induction Oxygen Delivery Method: Circle system utilized Preoxygenation: Pre-oxygenation with 100% oxygen Induction Type: IV induction Ventilation: Mask ventilation without difficulty Laryngoscope Size: McGraph and 3 Grade View: Grade I Tube type: Oral Tube size: 7.0 mm Number of attempts: 1 Airway Equipment and Method: Stylet and Video-laryngoscopy Placement Confirmation: ETT inserted through vocal cords under direct vision,  positive ETCO2 and breath sounds checked- equal and bilateral Secured at: 21 cm Tube secured with: Tape Dental Injury: Teeth and Oropharynx as per pre-operative assessment

## 2020-01-23 NOTE — H&P (Signed)
PREOPERATIVE H&P  Chief Complaint: Left Complete Rotator Cuff Tear  HPI: Margaret Massey is a 69 y.o. female who presents for preoperative history and physical with a diagnosis of Left Complete Rotator Cuff Tear. Symptoms of pain, weakness and limited range of motion are significantly impairing activities of daily living.  MRI has confirmed a complete tear of the left supraspinatus with retraction. She has agreed with surgical treatment of the rotator cuff tear.   Past Medical History:  Diagnosis Date   Anxiety    Cancer [facility]) 06-21-2001   Renal Cell Carcinoma s/p nephrectomy   Chronic kidney disease    Colon polyp 01/29/2015   TUBULAR ADENOMA   Complication of anesthesia    Family history of adverse reaction to anesthesia    SISTER HARD TO WAKE UP   GERD (gastroesophageal reflux disease)    Hematuria, unspecified    Hyperlipidemia    Hypertension    Osteoporosis    PONV (postoperative nausea and vomiting)    Past Surgical History:  Procedure Laterality Date   BREAST BIOPSY Left 01/19/2018   BENIGN MAMMARY PARENCHYMA SHOWING USUAL DUCTAL HYPERPLASIA   BREAST LUMPECTOMY Left 02/22/2018   excision of ductal hyperplasia   BREAST LUMPECTOMY WITH NEEDLE LOCALIZATION Left 02/22/2018   Procedure: BREAST LUMPECTOMY WITH NEEDLE LOCALIZATION;  Surgeon: Jules Husbands, MD;  Location: [facility] ORS;  Service: General;  Laterality: Left;   COLONOSCOPY WITH PROPOFOL N/A 01/29/2015   Procedure: COLONOSCOPY WITH PROPOFOL;  Surgeon: Manya Silvas, MD;  Location: [place];  Service: Endoscopy;  Laterality: N/A;   jaw surgery     corrective procedure   right kidney removal Right 10/01/2001   Renal Cell Carcinoma.   Social History   Socioeconomic History   Marital status: Married    Spouse name: Not on file   Number of children: 2   Years of education: college   Highest education level: Not on file  Occupational History   Occupation: Librarian, academic   Occupation:  works with husband  Tobacco Use   Smoking status: Never Smoker   Smokeless tobacco: Never Used  Scientific laboratory technician Use: Never used  Substance and Sexual Activity   Alcohol use: Yes    Alcohol/week: 4.0 - 5.0 standard drinks    Types: 4 - 5 Glasses of wine per week    Comment: 1 glass of wine per month on average.   Drug use: No   Sexual activity: Yes    Birth control/protection: Post-menopausal  Other Topics Concern   Not on file  Social History Narrative   Marital status: married x 44 years.      Children: 2 children yet 1 living son (other son died in Jun 21, 2016 okay yessuicide): 4 grandchildren.      Lives: with husband.      Employment:  Sales promotion account executive for [employer].      Tobacco: never      Alcohol: glass of wine 4-5 times a year.      Drugs: none     Education:College. Exercise:  None/Inactive      ADLs; independent with ADLs.      Advanced Directives: FULL CODE.   Social Determinants of Health   Financial Resource Strain:    Difficulty of Paying Living Expenses: Not on file  Food Insecurity:    Worried About Charity fundraiser in the Last Year: Not on file   [employer] of Food in the Last Year: Not on file  Transportation  Needs:    Lack of Transportation (Medical): Not on file   Lack of Transportation (Non-Medical): Not on file  Physical Activity:    Days of Exercise per Week: Not on file   Minutes of Exercise per Session: Not on file  Stress:    Feeling of Stress : Not on file  Social Connections:    Frequency of Communication with Friends and Family: Not on file   Frequency of Social Gatherings with Friends and Family: Not on file   Attends Religious Services: Not on file   Active Member of Clubs or Organizations: Not on file   Attends Archivist Meetings: Not on file   Marital Status: Not on file   Family History  Problem Relation Age of Onset   Heart disease Father    Heart disease Sister        pacemaker   Diabetes  Sister    Hyperlipidemia Sister    Hypertension Mother    Depression Mother    Osteoporosis Mother    Heart disease Maternal Grandmother    Heart disease Maternal Grandfather    Breast cancer Maternal Aunt    Ovarian cancer Neg Hx    Allergies  Allergen Reactions   Morphine And Related Nausea And Vomiting   Prior to Admission medications   Medication Sig Start Date End Date Taking? Authorizing Provider  aspirin EC 81 MG tablet Take 81 mg by mouth at bedtime.   Yes [provider]  atorvastatin (LIPITOR) 10 MG tablet TAKE ONE TABLET BY MOUTH EVERY DAY Patient taking differently: Take 10 mg by mouth at bedtime.  04/25/17  Yes Wardell Honour, MD  Calcium Carb-Cholecalciferol (CALCIUM + VITAMIN D3 PO) Take 1 tablet by mouth at bedtime.   Yes [provider]  escitalopram (LEXAPRO) 20 MG tablet TAKE ONE TABLET BY MOUTH EVERY DAY Patient taking differently: Take 20 mg by mouth at bedtime.  04/25/17  Yes Wardell Honour, MD  fish oil-omega-3 fatty acids 1000 MG capsule Take 1 g by mouth at bedtime.    Yes [provider]  losartan (COZAAR) 50 MG tablet Take 1 tablet (50 mg total) by mouth daily. Patient taking differently: Take 50 mg by mouth at bedtime.  07/12/17  Yes Wardell Honour, MD  omeprazole (PRILOSEC) 20 MG capsule Take 1 capsule (20 mg total) by mouth daily. Patient taking differently: Take 20 mg by mouth at bedtime.  09/30/17  Yes Wardell Honour, MD     Positive ROS: All other systems have been reviewed and were otherwise negative with the exception of those mentioned in the HPI and as above.  Physical Exam: General: Alert, no acute distress Cardiovascular: Regular rate and rhythm, no murmurs rubs or gallops.  No pedal edema Respiratory: Clear to auscultation bilaterally, no wheezes rales or rhonchi. No cyanosis, no use of accessory musculature GI: No organomegaly, abdomen is soft and non-tender nondistended with positive bowel sounds. Skin:  Skin intact, no lesions within the operative field. Neurologic: Sensation intact distally Psychiatric: Patient is competent for consent with normal mood and affect Lymphatic: No cervical lymphadenopathy  MUSCULOSKELETAL: Left shoulder: Patient has pain with forward elevation abduction at 90 degrees.  Patient has weakness of shoulder abduction and external rotation.  She has pain with impingement testing.  She has full digital wrist and elbow range of motion, intact 6 light touch and a palpable radial pulse.   Assessment: Left Complete Rotator Cuff Tear  Plan: Plan for Procedure(s): LEFT SHOULDER  ARTHROSCOPY WITH SUBACCROMINAL DECOMPRESSION, BICEPS TENODESIS, DISTAL CLAVICAL EXCISION, MINI-OPEN ROTATOR CUFF REPAIR  I reviewed the details of the operation as well as the postoperative course with the patient.  I discussed the risks and benefits of surgery. The patient understands the risks include but are not limited to infection, bleeding , nerve or blood vessel injury, joint stiffness or loss of motion, persistent pain, weakness or instability, retear of the rotator cuff and the need for further surgery. Medical risks include but are not limited to DVT and pulmonary embolism, myocardial infarction, stroke, pneumonia, respiratory failure and death. Patient understood these risks and wished to proceed.     Thornton Park, MD   01/23/2020 9:38 AM

## 2020-01-23 NOTE — Op Note (Addendum)
01/23/2020  1:34 PM  PATIENT:  Margaret Massey  69 y.o. female  PRE-OPERATIVE DIAGNOSIS:  Left Complete Rotator Cuff Tear.  Subacromial impingement and acromioclavicular joint arthrosis  POST-OPERATIVE DIAGNOSIS:  Left Complete Rotator Cuff Tear  PROCEDURE:  Procedure(s): LEFT SHOULDER ARTHROSCOPY WITH SUBACCROMINAL DECOMPRESSION, DISTAL CLAVICAL EXCISION, MINI-OPEN ROTATOR CUFF REPAIR  AND BICEPS TENODESIS  SURGEON:  Surgeon(s) and Role:    Thornton Park, MD - Primary  ANESTHESIA:   general and paracervical block  PREOPERATIVE INDICATIONS:  Margaret Massey is a  69 y.o. female with a diagnosis of full-thickness left Rotator Cuff Tear who failed conservative measures and elected for surgical management.    The risks benefits and alternatives were discussed with the patient preoperatively including but not limited to the risks of infection, bleeding, nerve injury, persistent pain or weakness, failure of the hardware, re-tear of the rotator cuff and the need for further surgery. Medical risks include DVT and pulmonary embolism, myocardial infarction, stroke, pneumonia, respiratory failure and death. Patient understood these risks and wished to proceed.  OPERATIVE IMPLANTS: Goodman Multifix anchors x 3 & Smith and Nephew Q Fix anchors x 2  OPERATIVE FINDINGS: Patient had a full-thickness tear involving the supraspinatus with retraction to the top of the humeral head.  Patient had a torn superior labrum.  Patient had significant subacromial bursitis and AC joint arthrosis.  OPERATIVE PROCEDURE: The patient was met in the preoperative area.  The preop history and physical was performed at the bedside.  The left shoulder was signed with the word yes and my initials according the hospital's correct site of surgery protocol. The patient underwent placement of a left interscalene block with Exparel by the anesthesia service.  The patient was then brought to the operating room where  they underwent general endotracheal intubation.  The patient was placed in a beachchair position.  A spider arm positioner was used for this case. Examination under anesthesia revealed no limitation of motion or instability with load shift testing. The patient had a negative sulcus sign.  The patient was prepped and draped in a sterile fashion. A timeout was performed to verify the patient's name, date of birth, medical record number, correct site of surgery and correct procedure to be performed there was also used to verify the patient received antibiotics that all appropriate instruments, implants and radiographs studies were available in the room. Once all in attendance were in agreement case began.  Bony landmarks were drawn out with a surgical marker along with proposed arthroscopy incisions. These were pre-injected with 1% lidocaine plain. An 11 blade was used to establish a posterior portal through which the arthroscope was placed in the glenohumeral joint. A full diagnostic examination of the shoulder was performed.  the anterior portal was established under direct visualization with an 18-gauge spinal needle. A 5.75 the medial arthroscopic cannula was placed through this anterior portal.   Patient was found to have a tear of the superior labrum destabilizing the biceps anchor, therefore the decision was made to perform a biceps tenodesis. An Arthocare Perfect Pass suture placed in the biceps tendon from the lateral portal and an arthroscopic tenotomy was performed.  The suture placed through the biceps tendon was clamped with a hemostat for later tenodesis.    The arthroscope was then placed in the subacromial space. A lateral portal was then established using an 18-gauge spinal needle for localization. Extensive bursitis was encountered and debrided using a 4.0 resector shaver blade and a  90 ArthroCare wand from the lateral portal. A subacromial decompression was also performed using a 5.5 mm  resector shaver blade from the lateral portal and a distal clavicle excision was performed from the anterior portal.    Two Smith & Nephew Perfect Pass sutures were placed in the lateral border of the rotator cuff tear. The greater tuberosity was debrided using a 4.0 mm resector shaver blade to remove all remaining torn fibers of the rotator cuff.  Debridement was performed until punctate bleeding was seen at the greater tuberosity footprint, which will allow for rotator cuff healing.  Final arthroscopic images were taken and all arthroscopic instruments were then removed and the mini-open portion of the procedure began.   A saber-type incision was made along the lateral border of the acromion. The deltoid muscle was identified and split in line with its fibers which allowed visualization of the rotator cuff.  The biceps tendon and its associated tagging suture were brought out through the deltoid split.  The Perfect Pass suture previously placed in the lateral border of the rotator cuff were also brought out through the deltoid split.  A second perfect pass suture was placed more distal into the biceps tendon and tied circumferentially around the tendon.    After the perfect pass suture was placed in the biceps tendon, approximately 15 mm of the intra-articular biceps was resected using a #15 blade.   A tenodesis was performed placing the biceps tendon at the top of the bicipital groove with a single multi fix anchor.    Two additional perfect pass sutures were then placed through the lateral border of the supraspinatus tear. The lateral edge of the rotator cuff was debrided until healthy, viable tissue remained.  Two Q fix anchors were then placed at the articular margin of the humeral head. The 4 limbs of each anchor were then passed medially through the rotator cuff with a perfect pass suture passer. These were clamped with a hemostat for later medial row fixation.  The four perfect pass sutures from  the lateral border of the rotator cuff were then anchored to the greater tuberosity of the humeral head using two Ozora Multifix anchors. These anchors were tensioned to allow for anatomic reduction of the rotator cuff to the greater tuberosity footprint.  The medial row repair was then completed using an arthroscopic knot tying technique with the Q fix anchor sutures.  Once all sutures were tied down, arthroscopic images of the double row repair were taken with the arthroscope both externally and arthroscopically from the glenohumeral joint.  All incisions were copiously irrigated. The deltoid fascia was repaired using a 0 Vicryl suturean interrupted fashion.  The subcutaneous tissue of all incisions were closed with a 2-0 Vicryl. Skin closure for the arthroscopic incisions was performed with 4-0 nylon. The skin edges of the saber incision were approximated with a running 4-0 undyed Monocryl.  A dry sterile dressing was applied.  The patient was placed in an abduction sling, with a Polar Care sleeve.  All sharp and instrument counts were correct at the conclusion of the case. I was scrubbed and present for the entire case. I spoke with the patient's husband postoperatively to let him know the case had been performed without complication and the patient was stable in recovery room.

## 2020-01-23 NOTE — Anesthesia Preprocedure Evaluation (Addendum)
Anesthesia Evaluation  Patient identified by MRN, date of birth, ID band Patient awake    Reviewed: Allergy & Precautions, H&P , NPO status , Patient's Chart, lab work & pertinent test results  History of Anesthesia Complications (+) PONVNegative for: history of anesthetic complications  Airway Mallampati: II  TM Distance: >3 FB Neck ROM: full    Dental   Pulmonary neg pulmonary ROS, neg sleep apnea, neg COPD,    breath sounds clear to auscultation       Cardiovascular hypertension, (-) angina(-) Past MI and (-) Cardiac Stents (-) dysrhythmias  Rhythm:regular Rate:Normal     Neuro/Psych PSYCHIATRIC DISORDERS Anxiety Depression negative neurological ROS     GI/Hepatic Neg liver ROS, GERD  Controlled,  Endo/Other  negative endocrine ROS  Renal/GU Renal disease (CKD)Single kidney; RCC s/p right nephrectomy     Musculoskeletal   Abdominal   Peds  Hematology negative hematology ROS (+)   Anesthesia Other Findings Past Medical History: No date: Anxiety 2003: Cancer [facility])     Comment:  Renal Cell Carcinoma s/p nephrectomy No date: Chronic kidney disease 01/29/2015: Colon polyp     Comment:  TUBULAR ADENOMA No date: Complication of anesthesia No date: Family history of adverse reaction to anesthesia     Comment:  SISTER HARD TO WAKE UP No date: GERD (gastroesophageal reflux disease) No date: Hematuria, unspecified No date: Hyperlipidemia No date: Hypertension No date: Osteoporosis No date: PONV (postoperative nausea and vomiting)  Past Surgical History: 01/19/2018: BREAST BIOPSY; Left     Comment:  BENIGN MAMMARY PARENCHYMA SHOWING USUAL DUCTAL               HYPERPLASIA 02/22/2018: BREAST LUMPECTOMY; Left     Comment:  excision of ductal hyperplasia 02/22/2018: BREAST LUMPECTOMY WITH NEEDLE LOCALIZATION; Left     Comment:  Procedure: BREAST LUMPECTOMY WITH NEEDLE LOCALIZATION;                Surgeon: Jules Husbands, MD;  Location: [facility] ORS;                Service: General;  Laterality: Left; 01/29/2015: COLONOSCOPY WITH PROPOFOL; N/A     Comment:  Procedure: COLONOSCOPY WITH PROPOFOL;  Surgeon: Manya Silvas, MD;  [facility] END[facility];  Service:               Endoscopy;  Laterality: N/A; No date: jaw surgery     Comment:  corrective procedure 10/01/2001: right kidney removal; Right     Comment:  Renal Cell Carcinoma.  BMI    Body Mass Index: 27.02 kg/m      Reproductive/Obstetrics negative OB ROS                           Anesthesia Physical Anesthesia Plan  ASA: II  Anesthesia Plan: General ETT   Post-op Pain Management: GA combined w/ Regional for post-op pain   Induction:   PONV Risk Score and Plan: Ondansetron, Dexamethasone and Treatment may vary due to age or medical condition  Airway Management Planned:   Additional Equipment:   Intra-op Plan:   Post-operative Plan:   Informed Consent: I have reviewed the patients History and Physical, chart, labs and discussed the procedure including the risks, benefits and alternatives for the proposed anesthesia with the patient or authorized representative who has indicated his/her understanding and acceptance.     Dental  Advisory Given  Plan Discussed with: Anesthesiologist, CRNA and Surgeon  Anesthesia Plan Comments:        Anesthesia Quick Evaluation

## 2020-01-23 NOTE — Transfer of Care (Signed)
Immediate Anesthesia Transfer of Care Note  Patient: Margaret Massey  Procedure(s) Performed: LEFT SHOULDER ARTHROSCOPY WITH SUBACCROMINAL DECOMPRESSION, BICEPS TENODESIS, DISTAL CLAVICAL EXCISION, MINI-OPEN ROTATOR CUFF REPAIR (Left )  Patient Location: PACU  Anesthesia Type:General  Level of Consciousness: awake and drowsy  Airway & Oxygen Therapy: Patient Spontanous Breathing and Patient connected to face mask oxygen  Post-op Assessment: Report given to RN and Post -op Vital signs reviewed and stable  Post vital signs: Reviewed and stable  Last Vitals:  Vitals Value Taken Time  BP 121/66 01/23/20 1300  Temp 36.5 C 01/23/20 1300  Pulse 90 01/23/20 1302  Resp 17 01/23/20 1302  SpO2 96 % 01/23/20 1302  Vitals shown include unvalidated device data.  Last Pain:  Vitals:   01/23/20 0940  TempSrc:   PainSc: 0-No pain      Patients Stated Pain Goal: 2 (53/66/44 [id number])  Complications: No complications documented.

## 2020-01-23 NOTE — Discharge Instructions (Addendum)
Interscalene Nerve Block with Exparel  1.  For your surgery you have received an Interscalene Nerve Block with Exparel. 2. Nerve Blocks affect many types of nerves, including nerves that control movement, pain and normal sensation.  You may experience feelings such as numbness, tingling, heaviness, weakness or the inability to move your arm or the feeling or sensation that your arm has "fallen asleep". 3. A nerve block with Exparel can last up to 5 days.  Usually the weakness wears off first.  The tingling and heaviness usually wear off next.  Finally you may start to notice pain.  Keep in mind that this may occur in any order.  Once a nerve block starts to wear off it is usually completely gone within 60 minutes. 4. ISNB may cause mild shortness of breath, a hoarse voice, blurry vision, unequal pupils, or drooping of the face on the same side as the nerve block.  These symptoms will usually resolve with the numbness.  Very rarely the procedure itself can cause mild seizures. 5. If needed, your surgeon will give you a prescription for pain medication.  It will take about 60 minutes for the oral pain medication to become fully effective.  So, it is recommended that you start taking this medication before the nerve block first begins to wear off, or when you first begin to feel discomfort. 6. Take your pain medication only as prescribed.  Pain medication can cause sedation and decrease your breathing if you take more than you need for the level of pain that you have. 7. Nausea is a common side effect of many pain medications.  You may want to eat something before taking your pain medicine to prevent nausea. 8. After an Interscalene nerve block, you cannot feel pain, pressure or extremes in temperature in the effected arm.  Because your arm is numb it is at an increased risk for injury.  To decrease the possibility of injury, please practice the following:  a. While you are awake change the position of  your arm frequently to prevent too much pressure on any one area for prolonged periods of time. b.  If you have a cast or tight dressing, check the color or your fingers every couple of hours.  Call your surgeon with the appearance of any discoloration (white or blue). c. If you are given a sling to wear before you go home, please wear it  at all times until the block has completely worn off.  Do not get up at night without your sling. d. Please contact [facility] or your surgeon if you do not begin to regain sensation after 7 days from the surgery.  Anesthesia may be contacted by calling the Same Day Surgery Department, Mon. through Fri., 6 am to 4 pm at [phone].   e. If you experience any other problems or concerns, please contact your surgeon's office. f. If you experience severe or prolonged shortness of breath go to the nearest emergency department.   AMBULATORY SURGERY  DISCHARGE INSTRUCTIONS   1) The drugs that you were given will stay in your system until tomorrow so for the next 24 hours you should not:  A) Drive an automobile B) Make any legal decisions C) Drink any alcoholic beverage   2) You may resume regular meals tomorrow.  Today it is better to start with liquids and gradually work up to solid foods.  You may eat anything you prefer, but it is better to start with liquids, then soup   and crackers, and gradually work up to solid foods.   3) Please notify your doctor immediately if you have any unusual bleeding, trouble breathing, redness and pain at the surgery site, drainage, fever, or pain not relieved by medication.    4) Additional Instructions:        Please contact your physician with any problems or Same Day Surgery at [phone], Monday through Friday 6 am to 4 pm, or [facility] [place] [facility] number at [phone]. 

## 2020-01-23 NOTE — Anesthesia Procedure Notes (Signed)
Anesthesia Regional Block: Interscalene brachial plexus block   Pre-Anesthetic Checklist: ,, timeout performed, Correct Patient, Correct Site, Correct Laterality, Correct Procedure, Correct Position, site marked, Risks and benefits discussed,  Surgical consent,  Pre-op evaluation,  At surgeon's request and post-op pain management  Laterality: Left  Prep: chloraprep       Needles:  Injection technique: Single-shot  Needle Type: Stimulator Needle - 40     Needle Length: 9cm  Needle Gauge: 21     Additional Needles:   Procedures:,,,, ultrasound used (permanent image in chart),,,,  Narrative:  Start time: 01/23/2020 9:29 AM End time: 01/23/2020 9:31 AM  Performed by: Personally  Anesthesiologist: Tera Mater, MD  Additional Notes: Risks and benefits of nerve block discussed with patient, including but not limited to risk of nerve injury, bleeding, infection, and failed block.  Patient expressed understanding and consented to block placement.   Functioning IV was confirmed and monitors were applied.  Sterile prep,hand hygiene and sterile gloves were used.  Minimal sedation used for procedure.  During the procedure, there was negative aspiration, negative paresthesia on injection, and dose was given in divided aliquots under ultrasound guidance.  Patient tolerated the procedure well with no immediate complications.

## 2020-01-23 NOTE — OR Nursing (Signed)
Per nurse in [facility] room, per Dr. Mack Guise, patient may resume taking aspirin tomorrow.  Added to d/c instructions/med section.

## 2020-01-24 ENCOUNTER — Encounter: Payer: Self-pay | Admitting: Orthopedic Surgery

## 2020-01-24 NOTE — Anesthesia Postprocedure Evaluation (Signed)
Anesthesia Post Note  Patient: Margaret Massey  Procedure(s) Performed: LEFT SHOULDER ARTHROSCOPY WITH SUBACCROMINAL DECOMPRESSION, BICEPS TENODESIS, DISTAL CLAVICAL EXCISION, MINI-OPEN ROTATOR CUFF REPAIR (Left )  Patient location during evaluation: PACU Anesthesia Type: General Level of consciousness: awake and alert Pain management: pain level controlled Vital Signs Assessment: post-procedure vital signs reviewed and stable Respiratory status: spontaneous breathing, nonlabored ventilation and respiratory function stable Cardiovascular status: blood pressure returned to baseline and stable Postop Assessment: no apparent nausea or vomiting Anesthetic complications: no   No complications documented.   Last Vitals:  Vitals:   01/23/20 1355 01/23/20 1429  BP: (!) 117/58 131/66  Pulse: 74 74  Resp: 16 16  Temp: 36.6 C   SpO2: 93% 98%    Last Pain:  Vitals:   01/23/20 1429  TempSrc:   PainSc: 0-No pain                 Brett Canales Marquie Aderhold

## 2020-04-28 ENCOUNTER — Other Ambulatory Visit: Payer: Self-pay

## 2020-04-28 ENCOUNTER — Other Ambulatory Visit
Admission: RE | Admit: 2020-04-28 | Discharge: 2020-04-28 | Disposition: A | Discharge status: Home / Self Care | Payer: Medicare Other | Source: Ambulatory Visit | Attending: Specialist | Admitting: Specialist

## 2020-04-28 DIAGNOSIS — Z20822 Contact with and (suspected) exposure to covid-19: Secondary | ICD-10-CM | POA: Diagnosis not present

## 2020-04-28 DIAGNOSIS — Z01812 Encounter for preprocedural laboratory examination: Secondary | ICD-10-CM | POA: Diagnosis present

## 2020-04-28 LAB — SARS CORONAVIRUS 2 (TAT 6-24 HRS): SARS Coronavirus 2: NEGATIVE

## 2020-04-30 ENCOUNTER — Encounter: Payer: Self-pay | Admitting: Specialist

## 2020-04-30 ENCOUNTER — Ambulatory Visit: Payer: Medicare Other | Admitting: Anesthesiology

## 2020-04-30 ENCOUNTER — Other Ambulatory Visit: Payer: Self-pay | Admitting: Specialist

## 2020-04-30 ENCOUNTER — Ambulatory Visit: Payer: Medicare Other

## 2020-04-30 ENCOUNTER — Other Ambulatory Visit: Payer: Self-pay

## 2020-04-30 ENCOUNTER — Ambulatory Visit
Admission: RE | Admit: 2020-04-30 | Discharge: 2020-04-30 | Disposition: A | Discharge status: Home / Self Care | Payer: Medicare Other | Attending: Specialist | Admitting: Specialist

## 2020-04-30 ENCOUNTER — Encounter
Admission: RE | Disposition: A | Discharge status: Home / Self Care | Payer: Self-pay | Source: Home / Self Care | Attending: Specialist

## 2020-04-30 DIAGNOSIS — Z833 Family history of diabetes mellitus: Secondary | ICD-10-CM | POA: Insufficient documentation

## 2020-04-30 DIAGNOSIS — Z419 Encounter for procedure for purposes other than remedying health state, unspecified: Secondary | ICD-10-CM

## 2020-04-30 DIAGNOSIS — Z905 Acquired absence of kidney: Secondary | ICD-10-CM | POA: Insufficient documentation

## 2020-04-30 DIAGNOSIS — Z8249 Family history of ischemic heart disease and other diseases of the circulatory system: Secondary | ICD-10-CM | POA: Diagnosis not present

## 2020-04-30 DIAGNOSIS — Z8349 Family history of other endocrine, nutritional and metabolic diseases: Secondary | ICD-10-CM | POA: Insufficient documentation

## 2020-04-30 DIAGNOSIS — X58XXXA Exposure to other specified factors, initial encounter: Secondary | ICD-10-CM | POA: Diagnosis not present

## 2020-04-30 DIAGNOSIS — Z85528 Personal history of other malignant neoplasm of kidney: Secondary | ICD-10-CM | POA: Insufficient documentation

## 2020-04-30 DIAGNOSIS — Z7982 Long term (current) use of aspirin: Secondary | ICD-10-CM | POA: Diagnosis not present

## 2020-04-30 DIAGNOSIS — Z7983 Long term (current) use of bisphosphonates: Secondary | ICD-10-CM | POA: Insufficient documentation

## 2020-04-30 DIAGNOSIS — Z803 Family history of malignant neoplasm of breast: Secondary | ICD-10-CM | POA: Insufficient documentation

## 2020-04-30 DIAGNOSIS — Z885 Allergy status to narcotic agent status: Secondary | ICD-10-CM | POA: Insufficient documentation

## 2020-04-30 DIAGNOSIS — Z8262 Family history of osteoporosis: Secondary | ICD-10-CM | POA: Insufficient documentation

## 2020-04-30 DIAGNOSIS — Z01810 Encounter for preprocedural cardiovascular examination: Secondary | ICD-10-CM | POA: Diagnosis not present

## 2020-04-30 DIAGNOSIS — S52531A Colles' fracture of right radius, initial encounter for closed fracture: Secondary | ICD-10-CM | POA: Diagnosis present

## 2020-04-30 DIAGNOSIS — Z86010 Personal history of colonic polyps: Secondary | ICD-10-CM | POA: Diagnosis not present

## 2020-04-30 DIAGNOSIS — Z79899 Other long term (current) drug therapy: Secondary | ICD-10-CM | POA: Insufficient documentation

## 2020-04-30 HISTORY — PX: OPEN REDUCTION INTERNAL FIXATION (ORIF) DISTAL RADIAL FRACTURE: SHX5989

## 2020-04-30 LAB — BASIC METABOLIC PANEL
Anion gap: 9 (ref 5–15)
BUN: 28 mg/dL — ABNORMAL HIGH (ref 8–23)
CO2: 23 mmol/L (ref 22–32)
Calcium: 9.1 mg/dL (ref 8.9–10.3)
Chloride: 108 mmol/L (ref 98–111)
Creatinine, Ser: 1.35 mg/dL — ABNORMAL HIGH (ref 0.44–1.00)
GFR, Estimated: 42 mL/min — ABNORMAL LOW (ref >60)
Glucose, Bld: 109 mg/dL — ABNORMAL HIGH (ref 70–99)
Potassium: 4.3 mmol/L (ref 3.5–5.1)
Sodium: 140 mmol/L (ref 135–145)

## 2020-04-30 LAB — CBC
HCT: 36.6 % (ref 36.0–46.0)
Hemoglobin: 12.3 g/dL (ref 12.0–15.0)
MCH: 29.8 pg (ref 26.0–34.0)
MCHC: 33.6 g/dL (ref 30.0–36.0)
MCV: 88.6 fL (ref 80.0–100.0)
Platelets: 226 10*3/uL (ref 150–400)
RBC: 4.13 MIL/uL (ref 3.87–5.11)
RDW: 13 % (ref 11.5–15.5)
WBC: 5.9 10*3/uL (ref 4.0–10.5)
nRBC: 0 % (ref 0.0–0.2)

## 2020-04-30 SURGERY — OPEN REDUCTION INTERNAL FIXATION (ORIF) DISTAL RADIUS FRACTURE
Anesthesia: General | Laterality: Right

## 2020-04-30 MED ORDER — ESMOLOL HCL 100 MG/10ML IV SOLN
INTRAVENOUS | Status: DC | PRN
  Administered 2020-04-30: 20 mg via INTRAVENOUS

## 2020-04-30 MED ORDER — MELOXICAM 15 MG PO TABS: 15.0000 mg | ORAL_TABLET | Freq: Every day | ORAL | 3 refills | Status: AC

## 2020-04-30 MED ORDER — ROPIVACAINE HCL 5 MG/ML IJ SOLN
INTRAMUSCULAR | Status: DC | PRN
  Administered 2020-04-30: 20 mL via PERINEURAL

## 2020-04-30 MED ORDER — BUPIVACAINE HCL (PF) 0.5 % IJ SOLN
INTRAMUSCULAR | Status: DC | PRN
  Administered 2020-04-30: 20 mL

## 2020-04-30 MED ORDER — DROPERIDOL 2.5 MG/ML IJ SOLN
0.6250 mg | Freq: Once | INTRAMUSCULAR | Status: DC | PRN
  Filled 2020-04-30: qty 2

## 2020-04-30 MED ORDER — ONDANSETRON HCL 4 MG/2ML IJ SOLN
INTRAMUSCULAR | Status: DC | PRN
  Administered 2020-04-30: 4 mg via INTRAVENOUS

## 2020-04-30 MED ORDER — MIDAZOLAM HCL 2 MG/2ML IJ SOLN
INTRAMUSCULAR | Status: DC | PRN
  Administered 2020-04-30 (×2): 1 mg via INTRAVENOUS

## 2020-04-30 MED ORDER — CLINDAMYCIN PHOSPHATE 600 MG/50ML IV SOLN
600.0000 mg | INTRAVENOUS | Status: AC
  Administered 2020-04-30: 600 mg via INTRAVENOUS

## 2020-04-30 MED ORDER — MIDAZOLAM HCL 2 MG/2ML IJ SOLN
INTRAMUSCULAR | Status: AC
  Administered 2020-04-30: 1 mg via INTRAVENOUS
  Filled 2020-04-30: qty 2

## 2020-04-30 MED ORDER — MIDAZOLAM HCL 2 MG/2ML IJ SOLN: 1.0000 mg | INTRAMUSCULAR | Status: DC | PRN

## 2020-04-30 MED ORDER — GABAPENTIN 400 MG PO CAPS: 400.0000 mg | ORAL_CAPSULE | Freq: Three times a day (TID) | ORAL | 3 refills | Status: AC

## 2020-04-30 MED ORDER — MIDAZOLAM HCL 2 MG/2ML IJ SOLN
INTRAMUSCULAR | Status: AC
  Filled 2020-04-30: qty 2

## 2020-04-30 MED ORDER — FENTANYL CITRATE (PF) 100 MCG/2ML IJ SOLN
INTRAMUSCULAR | Status: AC
  Filled 2020-04-30: qty 2

## 2020-04-30 MED ORDER — CHLORHEXIDINE GLUCONATE CLOTH 2 % EX PADS: 6.0000 | MEDICATED_PAD | Freq: Once | CUTANEOUS | Status: DC

## 2020-04-30 MED ORDER — GLYCOPYRROLATE 0.2 MG/ML IJ SOLN
INTRAMUSCULAR | Status: DC | PRN
  Administered 2020-04-30: .2 mg via INTRAVENOUS

## 2020-04-30 MED ORDER — PROPOFOL 500 MG/50ML IV EMUL
INTRAVENOUS | Status: AC
  Filled 2020-04-30: qty 50

## 2020-04-30 MED ORDER — MELOXICAM 7.5 MG PO TABS
15.0000 mg | ORAL_TABLET | ORAL | Status: AC
  Administered 2020-04-30: 15 mg via ORAL

## 2020-04-30 MED ORDER — ORAL CARE MOUTH RINSE: 15.0000 mL | Freq: Once | OROMUCOSAL | Status: AC

## 2020-04-30 MED ORDER — ROPIVACAINE HCL 5 MG/ML IJ SOLN
INTRAMUSCULAR | Status: AC
  Filled 2020-04-30: qty 30

## 2020-04-30 MED ORDER — CEFAZOLIN SODIUM-DEXTROSE 2-4 GM/100ML-% IV SOLN
INTRAVENOUS | Status: AC
  Filled 2020-04-30: qty 100

## 2020-04-30 MED ORDER — MELOXICAM 7.5 MG PO TABS
ORAL_TABLET | ORAL | Status: AC
  Filled 2020-04-30: qty 2

## 2020-04-30 MED ORDER — FENTANYL CITRATE (PF) 100 MCG/2ML IJ SOLN
INTRAMUSCULAR | Status: DC | PRN
  Administered 2020-04-30 (×2): 25 ug via INTRAVENOUS

## 2020-04-30 MED ORDER — PROPOFOL 10 MG/ML IV BOLUS
INTRAVENOUS | Status: DC | PRN
  Administered 2020-04-30: 20 mg via INTRAVENOUS
  Administered 2020-04-30: 10 mg via INTRAVENOUS
  Administered 2020-04-30 (×2): 20 mg via INTRAVENOUS

## 2020-04-30 MED ORDER — LACTATED RINGERS IV SOLN: INTRAVENOUS | Status: DC

## 2020-04-30 MED ORDER — FENTANYL CITRATE (PF) 100 MCG/2ML IJ SOLN: 50.0000 ug | INTRAMUSCULAR | Status: DC | PRN

## 2020-04-30 MED ORDER — LIDOCAINE HCL (PF) 1 % IJ SOLN
INTRAMUSCULAR | Status: AC
  Filled 2020-04-30: qty 10

## 2020-04-30 MED ORDER — KETAMINE HCL 50 MG/5ML IJ SOSY
PREFILLED_SYRINGE | INTRAMUSCULAR | Status: AC
  Filled 2020-04-30: qty 5

## 2020-04-30 MED ORDER — CEFAZOLIN SODIUM-DEXTROSE 2-4 GM/100ML-% IV SOLN
2.0000 g | INTRAVENOUS | Status: AC
  Administered 2020-04-30: 2 g via INTRAVENOUS

## 2020-04-30 MED ORDER — BUPIVACAINE HCL (PF) 0.5 % IJ SOLN
INTRAMUSCULAR | Status: AC
  Filled 2020-04-30: qty 30

## 2020-04-30 MED ORDER — DEXAMETHASONE SODIUM PHOSPHATE 10 MG/ML IJ SOLN
INTRAMUSCULAR | Status: AC
  Filled 2020-04-30: qty 1

## 2020-04-30 MED ORDER — ACETAMINOPHEN 10 MG/ML IV SOLN
INTRAVENOUS | Status: AC
  Filled 2020-04-30: qty 100

## 2020-04-30 MED ORDER — GABAPENTIN 300 MG PO CAPS
ORAL_CAPSULE | ORAL | Status: AC
  Administered 2020-04-30: 300 mg via ORAL
  Filled 2020-04-30: qty 1

## 2020-04-30 MED ORDER — FENTANYL CITRATE (PF) 100 MCG/2ML IJ SOLN
25.0000 ug | INTRAMUSCULAR | Status: DC | PRN
Start: 2020-04-30 — End: 2020-04-30
  Administered 2020-04-30: 25 ug via INTRAVENOUS

## 2020-04-30 MED ORDER — ACETAMINOPHEN 160 MG/5ML PO SOLN
325.0000 mg | ORAL | Status: DC | PRN
Start: 2020-04-30 — End: 2020-04-30
  Filled 2020-04-30: qty 20.3

## 2020-04-30 MED ORDER — DEXAMETHASONE SODIUM PHOSPHATE 10 MG/ML IJ SOLN
INTRAMUSCULAR | Status: DC | PRN
  Administered 2020-04-30: 10 mg via INTRAVENOUS

## 2020-04-30 MED ORDER — CHLORHEXIDINE GLUCONATE 0.12 % MT SOLN: 15.0000 mL | Freq: Once | OROMUCOSAL | Status: AC

## 2020-04-30 MED ORDER — LIDOCAINE HCL (PF) 1 % IJ SOLN
INTRAMUSCULAR | Status: DC | PRN
  Administered 2020-04-30: 5 mL

## 2020-04-30 MED ORDER — PROMETHAZINE HCL 25 MG/ML IJ SOLN: 6.2500 mg | INTRAMUSCULAR | Status: DC | PRN

## 2020-04-30 MED ORDER — EPINEPHRINE PF 1 MG/ML IJ SOLN
INTRAMUSCULAR | Status: AC
  Filled 2020-04-30: qty 1

## 2020-04-30 MED ORDER — ACETAMINOPHEN 325 MG PO TABS: 325.0000 mg | ORAL_TABLET | ORAL | Status: DC | PRN

## 2020-04-30 MED ORDER — ACETAMINOPHEN 10 MG/ML IV SOLN
INTRAVENOUS | Status: DC | PRN
  Administered 2020-04-30: 1000 mg via INTRAVENOUS

## 2020-04-30 MED ORDER — FENTANYL CITRATE (PF) 100 MCG/2ML IJ SOLN
INTRAMUSCULAR | Status: AC
  Administered 2020-04-30: 50 ug via INTRAVENOUS
  Filled 2020-04-30: qty 2

## 2020-04-30 MED ORDER — GABAPENTIN 300 MG PO CAPS: 300.0000 mg | ORAL_CAPSULE | ORAL | Status: AC

## 2020-04-30 MED ORDER — KETAMINE HCL 10 MG/ML IJ SOLN
INTRAMUSCULAR | Status: DC | PRN
  Administered 2020-04-30: 10 mg via INTRAVENOUS
  Administered 2020-04-30: 20 mg via INTRAVENOUS
  Administered 2020-04-30 (×2): 10 mg via INTRAVENOUS

## 2020-04-30 MED ORDER — CHLORHEXIDINE GLUCONATE 0.12 % MT SOLN
OROMUCOSAL | Status: AC
  Administered 2020-04-30: 15 mL via OROMUCOSAL
  Filled 2020-04-30: qty 15

## 2020-04-30 MED ORDER — HYDROCODONE-ACETAMINOPHEN 5-325 MG PO TABS: 1.0000 | ORAL_TABLET | Freq: Four times a day (QID) | ORAL | 0 refills | Status: AC | PRN

## 2020-04-30 MED ORDER — CLINDAMYCIN PHOSPHATE 600 MG/50ML IV SOLN
INTRAVENOUS | Status: AC
  Filled 2020-04-30: qty 50

## 2020-04-30 MED ORDER — PROPOFOL 500 MG/50ML IV EMUL
INTRAVENOUS | Status: DC | PRN
  Administered 2020-04-30: 100 ug/kg/min via INTRAVENOUS

## 2020-04-30 MED ORDER — NEOMYCIN-POLYMYXIN B GU 40-200000 IR SOLN
Status: AC
  Filled 2020-04-30: qty 2

## 2020-04-30 SURGICAL SUPPLY — 50 items
APL PRP STRL LF DISP 70% ISPRP (MISCELLANEOUS) ×1
BLADE SURG MINI STRL (BLADE) ×2 IMPLANT
BNDG COHESIVE 4X5 TAN STRL (GAUZE/BANDAGES/DRESSINGS) IMPLANT
BNDG ESMARK 4X12 TAN STRL LF (GAUZE/BANDAGES/DRESSINGS) ×2 IMPLANT
CANISTER SUCT 1200ML W/VALVE (MISCELLANEOUS) ×2 IMPLANT
CHLORAPREP W/TINT 26 (MISCELLANEOUS) ×2 IMPLANT
COVER WAND RF STERILE (DRAPES) ×2 IMPLANT
CUFF TOURN SGL QUICK 18X4 (TOURNIQUET CUFF) ×1 IMPLANT
DRAPE FLUOR MINI C-ARM 54X84 (DRAPES) ×2 IMPLANT
DRILL BIT (BIT) ×2 IMPLANT
DRSG GAUZE FLUFF 36X18 (GAUZE/BANDAGES/DRESSINGS) ×2 IMPLANT
ELECT REM PT RETURN 9FT ADLT (ELECTROSURGICAL) ×2
ELECTRODE REM PT RTRN 9FT ADLT (ELECTROSURGICAL) ×1 IMPLANT
GAUZE SPONGE 4X4 12PLY STRL (GAUZE/BANDAGES/DRESSINGS) ×2 IMPLANT
GAUZE XEROFORM 1X8 LF (GAUZE/BANDAGES/DRESSINGS) ×2 IMPLANT
GLOVE INDICATOR 8.0 STRL GRN (GLOVE) ×2 IMPLANT
GLOVE SURG ORTHO LTX SZ8.5 (GLOVE) ×2 IMPLANT
GOWN STRL REUS W/ TWL LRG LVL3 (GOWN DISPOSABLE) ×1 IMPLANT
GOWN STRL REUS W/TWL LRG LVL3 (GOWN DISPOSABLE) ×2
GOWN STRL REUS W/TWL LRG LVL4 (GOWN DISPOSABLE) ×2 IMPLANT
K-WIRE .062 (WIRE) ×2
K-WIRE FX7.25X.062XNS KRSH (WIRE) ×1
KIT TURNOVER KIT A (KITS) ×2 IMPLANT
KWIRE FX7.25X.062XNS KRSH (WIRE) IMPLANT
MANIFOLD NEPTUNE II (INSTRUMENTS) ×2 IMPLANT
NDL SAFETY ECLIPSE 18X1.5 (NEEDLE) ×1 IMPLANT
NEEDLE HYPO 18GX1.5 SHARP (NEEDLE) ×2
NS IRRIG 500ML POUR BTL (IV SOLUTION) ×2 IMPLANT
PACK EXTREMITY ARMC (MISCELLANEOUS) ×2 IMPLANT
PADDING CAST 4IN STRL (MISCELLANEOUS) ×2
PADDING CAST BLEND 4X4 STRL (MISCELLANEOUS) ×2 IMPLANT
PEG THREADED 2.5MMX22MM LONG (Peg) ×2 IMPLANT
PLATE STAN 24.4X59.5 RT (Plate) ×1 IMPLANT
SCREW BN 12X3.5XNS CORT TI (Screw) IMPLANT
SCREW CORT 3.5X12 (Screw) ×4 IMPLANT
SCREW CORT 3.5X14 LNG (Screw) ×1 IMPLANT
SCREW PEG LOCK 2.5X16 (Peg) ×1 IMPLANT
SCREW PEG LOCK 2.5X18 (Peg) ×2 IMPLANT
SCREW PEG LOCK 2.5X20 (Peg) ×1 IMPLANT
SPLINT CAST 1 STEP 3X12 (MISCELLANEOUS) ×2 IMPLANT
SPONGE LAP 18X18 RF (DISPOSABLE) ×2 IMPLANT
STAPLER SKIN PROX 35W (STAPLE) ×2 IMPLANT
STOCKINETTE 48X4 2 PLY STRL (GAUZE/BANDAGES/DRESSINGS) ×1 IMPLANT
STOCKINETTE BIAS CUT 4 980044 (GAUZE/BANDAGES/DRESSINGS) ×2 IMPLANT
STOCKINETTE STRL 4IN 9604848 (GAUZE/BANDAGES/DRESSINGS) ×2 IMPLANT
SUT VIC AB 2-0 SH 27 (SUTURE) ×2
SUT VIC AB 2-0 SH 27XBRD (SUTURE) ×1 IMPLANT
SUT VIC AB 3-0 PS2 18 (SUTURE) ×2 IMPLANT
SUT VIC AB 3-0 SH 27 (SUTURE) ×2
SUT VIC AB 3-0 SH 27X BRD (SUTURE) ×1 IMPLANT

## 2020-04-30 NOTE — Transfer of Care (Signed)
Immediate Anesthesia Transfer of Care Note  Patient: Margaret Massey  Procedure(s) Performed: OPEN REDUCTION INTERNAL FIXATION (ORIF) DISTAL RADIAL FRACTURE (Right )  Patient Location: PACU  Anesthesia Type:Regional  Level of Consciousness: awake, drowsy and patient cooperative  Airway & Oxygen Therapy: Patient Spontanous Breathing and Patient connected to face mask oxygen  Post-op Assessment: Report given to RN and Post -op Vital signs reviewed and stable  Post vital signs: Reviewed and stable  Last Vitals:  Vitals Value Taken Time  BP 106/61 04/30/20 1123  Temp 36.7 C 04/30/20 1123  Pulse 87 04/30/20 1128  Resp 11 04/30/20 1128  SpO2 100 % 04/30/20 1128  Vitals shown include unvalidated device data.  Last Pain:  Vitals:   04/30/20 0940  TempSrc:   PainSc: 0-No pain         Complications: No complications documented.

## 2020-04-30 NOTE — Anesthesia Preprocedure Evaluation (Addendum)
Anesthesia Evaluation  Patient identified by MRN, date of birth, ID band Patient awake    Reviewed: Allergy & Precautions, H&P , NPO status , reviewed documented beta blocker date and time   History of Anesthesia Complications (+) PONV, Family history of anesthesia reaction and history of anesthetic complications  Airway Mallampati: II  TM Distance: >3 FB Neck ROM: full    Dental  (+) Chipped, Missing, Teeth Intact   Pulmonary    Pulmonary exam normal        Cardiovascular hypertension,      Neuro/Psych PSYCHIATRIC DISORDERS Anxiety Depression    GI/Hepatic GERD  Controlled,  Endo/Other    Renal/GU Renal disease     Musculoskeletal   Abdominal   Peds  Hematology   Anesthesia Other Findings Past Medical History: No date: Anxiety 2003: Cancer [facility])     Comment:  Renal Cell Carcinoma s/p nephrectomy No date: Chronic kidney disease 01/29/2015: Colon polyp     Comment:  TUBULAR ADENOMA No date: Complication of anesthesia No date: Family history of adverse reaction to anesthesia     Comment:  SISTER HARD TO WAKE UP No date: GERD (gastroesophageal reflux disease) No date: Hematuria, unspecified No date: Hyperlipidemia No date: Hypertension No date: Osteoporosis No date: PONV (postoperative nausea and vomiting)  Past Surgical History: 01/19/2018: BREAST BIOPSY; Left     Comment:  BENIGN MAMMARY PARENCHYMA SHOWING USUAL DUCTAL               HYPERPLASIA 02/22/2018: BREAST LUMPECTOMY; Left     Comment:  excision of ductal hyperplasia 02/22/2018: BREAST LUMPECTOMY WITH NEEDLE LOCALIZATION; Left     Comment:  Procedure: BREAST LUMPECTOMY WITH NEEDLE LOCALIZATION;                Surgeon: Jules Husbands, MD;  Location: [facility] ORS;                Service: General;  Laterality: Left; 01/29/2015: COLONOSCOPY WITH PROPOFOL; N/A     Comment:  Procedure: COLONOSCOPY WITH PROPOFOL;  Surgeon: Manya Silvas,  MD;  Location: [facility] ENDOSCOPY;  Service:               Endoscopy;  Laterality: N/A; No date: jaw surgery     Comment:  corrective procedure 10/01/2001: right kidney removal; Right     Comment:  Renal Cell Carcinoma. 01/23/2020: SHOULDER ARTHROSCOPY WITH OPEN ROTATOR CUFF REPAIR AND  DISTAL CLAVICLE ACROMINECTOMY; Left     Comment:  Procedure: LEFT SHOULDER ARTHROSCOPY WITH SUBACCROMINAL               DECOMPRESSION, BICEPS TENODESIS, DISTAL CLAVICAL               EXCISION, MINI-OPEN ROTATOR CUFF REPAIR;  Surgeon:               Thornton Park, MD;  Location: [facility] ORS;  Service:               Orthopedics;  Laterality: Left;  BMI    Body Mass Index: 26.68 kg/m      Reproductive/Obstetrics                             Anesthesia Physical Anesthesia Plan  ASA: II  Anesthesia Plan: General   Post-op Pain Management:  Regional for Post-op pain   Induction: Intravenous  PONV Risk Score and Plan:  Treatment may vary due to age or medical condition and TIVA  Airway Management Planned: Nasal Cannula and Natural Airway  Additional Equipment:   Intra-op Plan:   Post-operative Plan:   Informed Consent: I have reviewed the patients History and Physical, chart, labs and discussed the procedure including the risks, benefits and alternatives for the proposed anesthesia with the patient or authorized representative who has indicated his/her understanding and acceptance.     Dental Advisory Given  Plan Discussed with: CRNA  Anesthesia Plan Comments: (Discussed option of GA or GA/block. Pt had previous block w shoulder surgery and is requesting a block at this time.)      Anesthesia Quick Evaluation

## 2020-04-30 NOTE — Op Note (Signed)
04/30/2020  2:36 PM  PATIENT:  Margaret Massey    PRE-OPERATIVE DIAGNOSIS:  E17.408X Colles' fracture of right radius, init for clos fx  POST-OPERATIVE DIAGNOSIS:  Same  PROCEDURE:  OPEN REDUCTION INTERNAL FIXATION (ORIF) DISTAL RADIAL FRACTURE  SURGEON:  Park Breed, MD  TOURNIQUET TIME:  101  MIN  ANESTHESIA:   General  PREOPERATIVE INDICATIONS:  Margaret Massey is a  70 y.o. female with a diagnosis of S52.531A Colles' fracture of right radius, init for clos fx who failed conservative measures and elected for surgical management.    The risks benefits and alternatives were discussed with the patient preoperatively including but not limited to the risks of infection, bleeding, nerve injury, malunion, nonunion, wrist stiffness, persistent wrist pain, osteoarthritis and the need for further surgery. Medical risks include but are not limited to DVT and pulmonary embolism, myocardial infarction, stroke, pneumonia, respiratory failure and death. Patient  understood these risks and wished to proceed.   OPERATIVE IMPLANTS: Biomet hand innovations plate, 4 hole  OPERATIVE FINDINGS: Comminution and shortening of fracture  OPERATIVE PROCEDURE: Patient was seen in the preoperative area. I marked the operative hand with the word yes and my initials according the hospital's correct site of surgery protocol. Patient was then brought to the operating roomand was placed supine on the operative table and underwent general anesthesia with an LMA.   The operative arm was prepped and draped in a sterile fashion. A timeout performed to verify the patient's name, date of birth, medical record number, correct site of surgery correct procedure to be performed. The timeout was also used a timeout to verify patient received antibiotics and appropriate instruments, implants and radiographs studies were available in the room. Once all in attendance were in agreement case began.   Patient then had the operative  extremity exsanguinated with an Esmarch. The tourniquet was placed on the upper extremity and inflated 250 mm.  A manual reduction of the fracture was performed. The fracture reduction was confirmed on FluoroScan imaging.  A linear incision was then made over the FCR tendon. The subcutaneous tissue was carefully dissected using Metzenbaum scissor and Adson pickup. Retractors were used to protect the radial artery and median nerve. The pronator quadratus was identified and incised and elevated off the volar surface of the distal radius. A 3  hole [employer] Innovations volar plate was then positioned on the under surface of the distal radius. It was held into position with a K wire. The position of the plate was confirmed on AP and lateral images. Once the plate was in good position a cortical screw was placed bicortically in the sliding hole. Attention was then turned to the distal pegs. The proximal row of pegs was placed first. Each individual peg hole was drilled and then measured with a depth gauge. The proximal row had 3 threaded pegs placed. The distal row was then drilled and smooth pegs were placed. The position and length of all screws were confirmed on AP and lateral FluoroScan imaging. Care was taken to avoid penetration of any peg through the articular surface of the distal radius.  Once all distal pegs were placed, the attention was turned back to placement of bicortical shaft screws. Additional screws were placed in the plate to fill the remaining holes. The wound was then copiously irrigated. Final FluoroScan imaging of the construct were taken. The fracture was in anatomic position and the hardware was well-positioned. The wound again was copiously irrigated. The soft tissue was  then carefully over the plate. The tissues were infiltrated with 1/2% marcaine.  The skin was closed with staples. Xeroform and a dry sterile dressing were applied along with a volar splint. I was scrubbed and present for the  entire case and all sharp and instrument counts were correct at the conclusion the case. The patient tolerated this procedure well and was awakened and taken to the recovery room in good condition.   Earnestine Leys, MD

## 2020-04-30 NOTE — Discharge Instructions (Signed)

## 2020-04-30 NOTE — H&P (Signed)
PREOPERATIVE H&P  Chief Complaint: S52.531A Colles' fracture of right radius, init for clos fx  HPI: Margaret Massey is a 70 y.o. female who presents for preoperative history and physical with a diagnosis of S52.531A Colles' fracture of right radius, init for clos fx. Symptoms are rated as moderate to severe, and have been worsening. The original reduction has moved and the fragments have shortened. I have recommended ORIF and the patient and her husband are in agreement.   This is significantly impairing activities of daily living.  She has elected for surgical management.   Past Medical History:  Diagnosis Date  . Anxiety   . Cancer [facility]) June 20, 2001   Renal Cell Carcinoma s/p nephrectomy  . Chronic kidney disease   . Colon polyp 01/29/2015   TUBULAR ADENOMA  . Complication of anesthesia   . Family history of adverse reaction to anesthesia    SISTER HARD TO WAKE UP  . GERD (gastroesophageal reflux disease)   . Hematuria, unspecified   . Hyperlipidemia   . Hypertension   . Osteoporosis   . PONV (postoperative nausea and vomiting)    Past Surgical History:  Procedure Laterality Date  . BREAST BIOPSY Left 01/19/2018   BENIGN MAMMARY PARENCHYMA SHOWING USUAL DUCTAL HYPERPLASIA  . BREAST LUMPECTOMY Left 02/22/2018   excision of ductal hyperplasia  . BREAST LUMPECTOMY WITH NEEDLE LOCALIZATION Left 02/22/2018   Procedure: BREAST LUMPECTOMY WITH NEEDLE LOCALIZATION;  Surgeon: Jules Husbands, MD;  Location: [facility] ORS;  Service: General;  Laterality: Left;  . COLONOSCOPY WITH PROPOFOL N/A 01/29/2015   Procedure: COLONOSCOPY WITH PROPOFOL;  Surgeon: Manya Silvas, MD;  Location: [facility] ENDOSCOPY;  Service: Endoscopy;  Laterality: N/A;  . jaw surgery     corrective procedure  . right kidney removal Right 10/01/2001   Renal Cell Carcinoma.  Marland Kitchen SHOULDER ARTHROSCOPY WITH OPEN ROTATOR CUFF REPAIR AND DISTAL CLAVICLE ACROMINECTOMY Left 01/23/2020   Procedure: LEFT SHOULDER ARTHROSCOPY WITH  SUBACCROMINAL DECOMPRESSION, BICEPS TENODESIS, DISTAL CLAVICAL EXCISION, MINI-OPEN ROTATOR CUFF REPAIR;  Surgeon: Thornton Park, MD;  Location: [facility] ORS;  Service: Orthopedics;  Laterality: Left;   Social History   Socioeconomic History  . Marital status: Married    Spouse name: Not on file  . Number of children: 2  . Years of education: college  . Highest education level: Not on file  Occupational History  . Occupation: Librarian, academic  . Occupation: works with husband  Tobacco Use  . Smoking status: Never Smoker  . Smokeless tobacco: Never Used  Vaping Use  . Vaping Use: Never used  Substance and Sexual Activity  . Alcohol use: Yes    Alcohol/week: 4.0 - 5.0 standard drinks    Types: 4 - 5 Glasses of wine per week    Comment: 1 glass of wine per month on average.  . Drug use: No  . Sexual activity: Yes    Birth control/protection: Post-menopausal  Other Topics Concern  . Not on file  Social History Narrative   Marital status: married x 44 years.      Children: 2 children yet 1 living son (other son died in 06-20-16 okay yessuicide): 4 grandchildren.      Lives: with husband.      Employment:  Sales promotion account executive for [employer].      Tobacco: never      Alcohol: glass of wine 4-5 times a year.      Drugs: none     Education:College. Exercise:  None/Inactive  ADLs; independent with ADLs.      Advanced Directives: FULL CODE.   Social Determinants of Health   Financial Resource Strain: Not on file  Food Insecurity: Not on file  Transportation Needs: Not on file  Physical Activity: Not on file  Stress: Not on file  Social Connections: Not on file   Family History  Problem Relation Age of Onset  . Heart disease Father   . Heart disease Sister        pacemaker  . Diabetes Sister   . Hyperlipidemia Sister   . Hypertension Mother   . Depression Mother   . Osteoporosis Mother   . Heart disease Maternal Grandmother   . Heart disease Maternal Grandfather   .  Breast cancer Maternal Aunt   . Ovarian cancer Neg Hx    Allergies  Allergen Reactions  . Morphine And Related Nausea And Vomiting   Prior to Admission medications   Medication Sig Start Date End Date Taking? Authorizing Provider  aspirin EC 81 MG tablet Take 81 mg by mouth at bedtime.   Yes [provider]  atorvastatin (LIPITOR) 10 MG tablet TAKE ONE TABLET BY MOUTH EVERY DAY Patient taking differently: Take 10 mg by mouth at bedtime. 04/25/17  Yes Wardell Honour, MD  Calcium Carb-Cholecalciferol (CALCIUM + VITAMIN D3 PO) Take 1 tablet by mouth at bedtime.   Yes [provider]  cetirizine (ZYRTEC) 10 MG tablet Take 10 mg by mouth daily.   Yes [provider]  escitalopram (LEXAPRO) 20 MG tablet TAKE ONE TABLET BY MOUTH EVERY DAY Patient taking differently: Take 20 mg by mouth at bedtime. 04/25/17  Yes Wardell Honour, MD  fish oil-omega-3 fatty acids 1000 MG capsule Take 1 g by mouth at bedtime.    Yes [provider]  ibandronate (BONIVA) 150 MG tablet Take 150 mg by mouth every 30 (thirty) days. 04/15/20  Yes [provider]  losartan (COZAAR) 100 MG tablet Take 100 mg by mouth daily.   Yes [provider]  omeprazole (PRILOSEC) 40 MG capsule Take 40 mg by mouth daily. 04/15/20  Yes [provider]  losartan (COZAAR) 50 MG tablet Take 1 tablet (50 mg total) by mouth daily. Patient not taking: Reported on 04/27/2020 07/12/17   Wardell Honour, MD  omeprazole (PRILOSEC) 20 MG capsule Take 1 capsule (20 mg total) by mouth daily. Patient not taking: No sig reported 09/30/17   Wardell Honour, MD  ondansetron (ZOFRAN) 4 MG tablet Take 1 tablet (4 mg total) by mouth every 8 (eight) hours as needed for nausea or vomiting. Patient not taking: No sig reported 01/23/20   Thornton Park, MD  oxyCODONE (OXY IR/ROXICODONE) 5 MG immediate release tablet Take 1 tablet (5 mg total) by mouth every 4 (four) hours as needed. Patient not taking: No  sig reported 01/23/20   Thornton Park, MD     Positive ROS: All other systems have been reviewed and were otherwise negative with the exception of those mentioned in the HPI and as above.  Physical Exam: General: Alert, no acute distress Cardiovascular: No pedal edema. Heart is regular and without murmur.  Respiratory: No cyanosis, no use of accessory musculature. Lungs are clear. GI: No organomegaly, abdomen is soft and non-tender Skin: No lesions in the area of chief complaint Neurologic: Sensation intact distally Psychiatric: Patient is competent for consent with normal mood and affect Lymphatic: No axillary or cervical lymphadenopathy  MUSCULOSKELETAL: Right wrist is tender and swollen.  CSM good. Mild deformity seen. Skin is intact.  Assessment: S52.531A Colles' fracture of right radius, init for clos fx  Plan: Plan for Procedure(s): OPEN REDUCTION INTERNAL FIXATION (ORIF) DISTAL RADIAL FRACTURE  The risks benefits and alternatives were discussed with the patient including but not limited to the risks of nonoperative treatment, versus surgical intervention including infection, bleeding, nerve injury,  blood clots, cardiopulmonary complications, morbidity, mortality, among others, and they were willing to proceed.   Park Breed, MD [phone]   04/30/2020 8:17 AM

## 2020-04-30 NOTE — H&P (Signed)
THE PATIENT WAS SEEN PRIOR TO SURGERY TODAY.  HISTORY, ALLERGIES, HOME MEDICATIONS AND OPERATIVE PROCEDURE WERE REVIEWED. RISKS AND BENEFITS OF SURGERY DISCUSSED WITH PATIENT AGAIN.  NO CHANGES FROM INITIAL HISTORY AND PHYSICAL NOTED.    

## 2020-04-30 NOTE — Anesthesia Postprocedure Evaluation (Signed)
Anesthesia Post Note  Patient: TAHRA HITZEMAN  Procedure(s) Performed: OPEN REDUCTION INTERNAL FIXATION (ORIF) DISTAL RADIAL FRACTURE (Right )  Patient location during evaluation: PACU Anesthesia Type: General Level of consciousness: awake and alert Pain management: pain level controlled Vital Signs Assessment: post-procedure vital signs reviewed and stable Respiratory status: spontaneous breathing, nonlabored ventilation and respiratory function stable Cardiovascular status: blood pressure returned to baseline and stable Postop Assessment: no apparent nausea or vomiting Anesthetic complications: no   No complications documented.   Last Vitals:  Vitals:   04/30/20 1251 04/30/20 1351  BP: (P) 133/75 (P) 131/63  Pulse: (P) 82 (P) 87  Resp: (P) 16 (P) 16  Temp: (P) 36.6 C (P) 36.8 C  SpO2: (P) 95% (P) 96%    Last Pain:  Vitals:   04/30/20 1351  TempSrc: (P) Oral  PainSc:                  Alphonsus Sias

## 2020-04-30 NOTE — Anesthesia Procedure Notes (Signed)
Procedure Name: General with mask airway Performed by: Fletcher-Harrison, Yohannes Waibel, CRNA Pre-anesthesia Checklist: Patient identified, Emergency Drugs available, Suction available and Patient being monitored Patient Re-evaluated:Patient Re-evaluated prior to induction Oxygen Delivery Method: Simple face mask Induction Type: IV induction Placement Confirmation: positive ETCO2 and CO2 detector Dental Injury: Teeth and Oropharynx as per pre-operative assessment        

## 2020-04-30 NOTE — Anesthesia Procedure Notes (Signed)
Anesthesia Regional Block: Supraclavicular block   Pre-Anesthetic Checklist: ,, timeout performed, Correct Patient, Correct Site, Correct Laterality, Correct Procedure, Correct Position, site marked, Risks and benefits discussed,  Surgical consent,  Pre-op evaluation,  At surgeon's request and post-op pain management  Laterality: Upper and Right  Prep: chloraprep       Needles:  Injection technique: Single-shot  Needle Type: Echogenic Stimulator Needle     Needle Length: 10cm  Needle Gauge: 21     Additional Needles:   Procedures: Doppler guided,,,, ultrasound used (permanent image in chart),,,,  Motor weakness within 6 minutes.  Narrative:  Start time: 04/30/2020 9:34 AM End time: 04/30/2020 9:40 AM Injection made incrementally with aspirations every 5 mL.  Performed by: Personally  Anesthesiologist: Alphonsus Sias, MD  Additional Notes: Functioning IV was confirmed and O2 [place]/monitors were applied. Light sedation administered as required, patient responsive throughout. A 184mm 21ga EchoStim needle was used. Sterile prep and drape,hand hygiene and sterile gloves were used.  Negative aspiration and negative test dose prior to incremental administration of local anesthetic. 1% Lidocaine for skin wheal, 4 ml. Total LA: 83ml - [place] 1% 35ml & 0.5% Ropivicaine 45ml/1:200 epi/5mg  decadron. U/S images stored in chart. The patient tolerated the procedure well.

## 2020-05-01 ENCOUNTER — Encounter: Payer: Self-pay | Admitting: Specialist

## 2020-05-13 IMAGING — MG DIGITAL SCREENING BILATERAL MAMMOGRAM WITH TOMO AND CAD
8 series · 9 of 24 positions shown · non-contrast
Comparison: Previous exam(s).

CLINICAL DATA: Screening.

EXAM:
DIGITAL SCREENING BILATERAL MAMMOGRAM WITH TOMO AND CAD

[R MLO synth-2D]
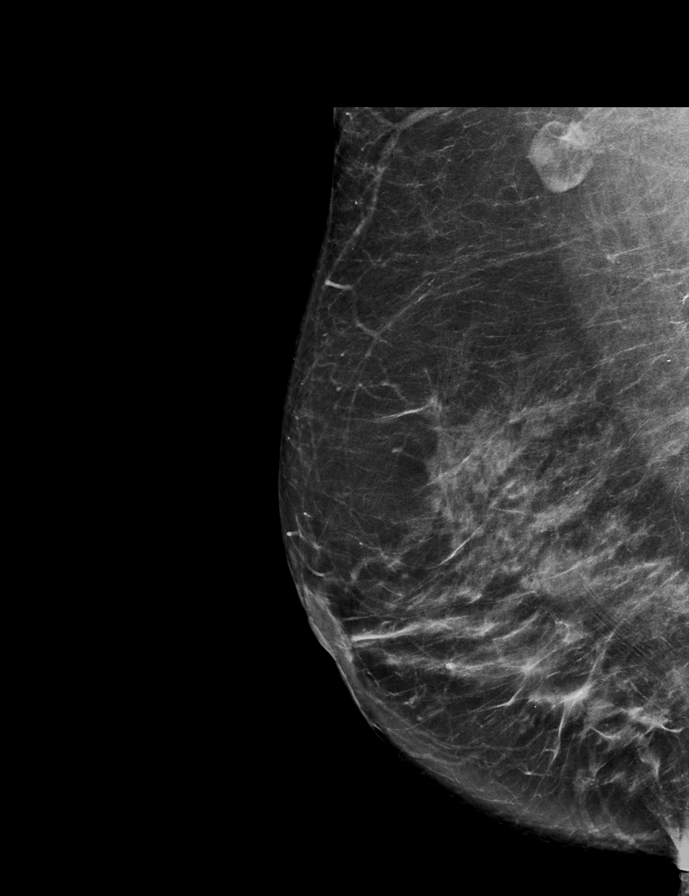

[R CC synth-2D]
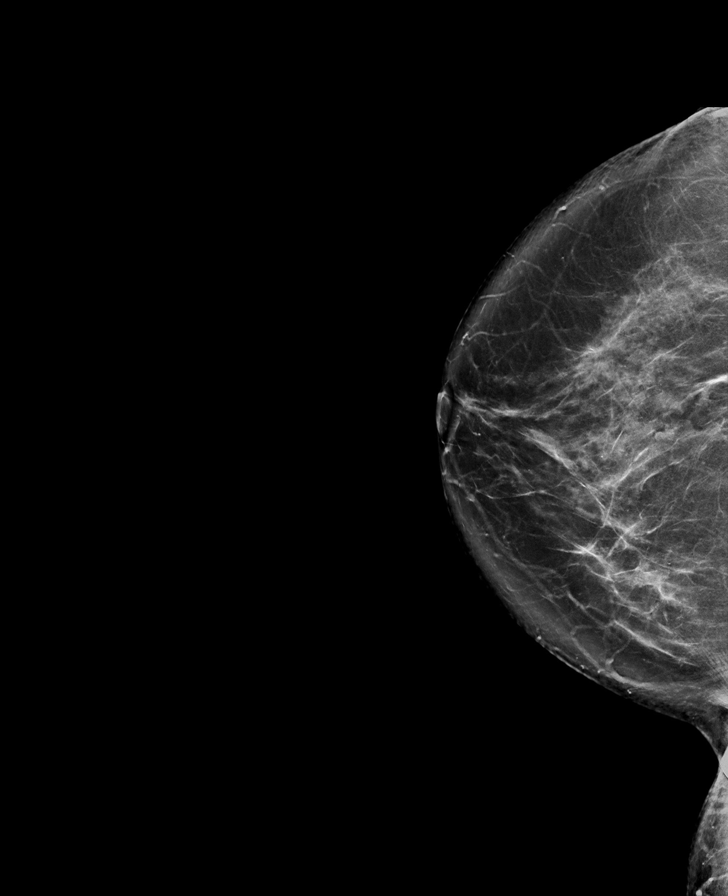

[L CC synth-2D]
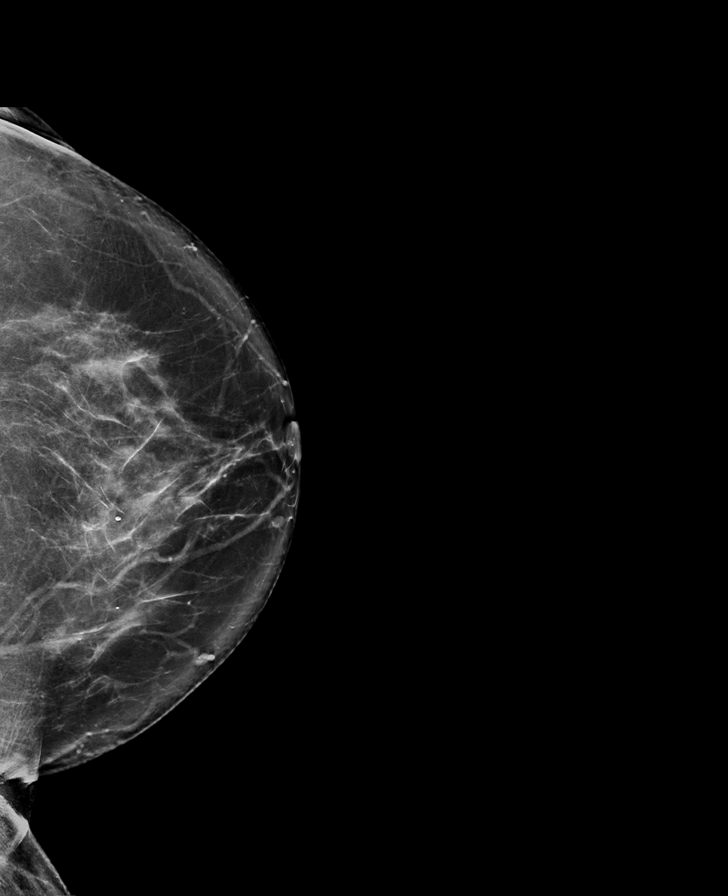

[L MLO synth-2D]
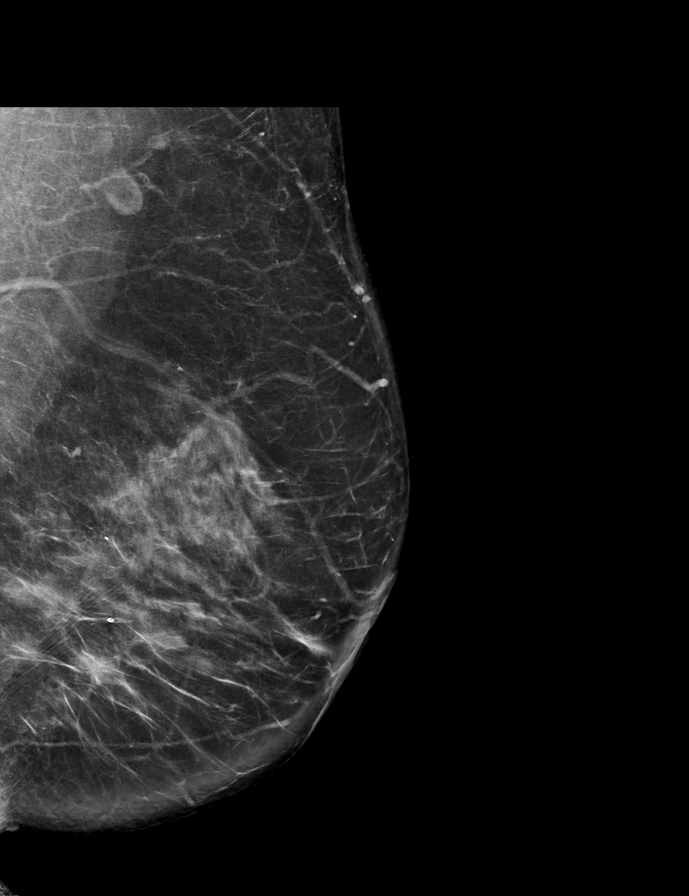

[R MLO tomo · 2 of 80 frames shown]
[frame 26/80]
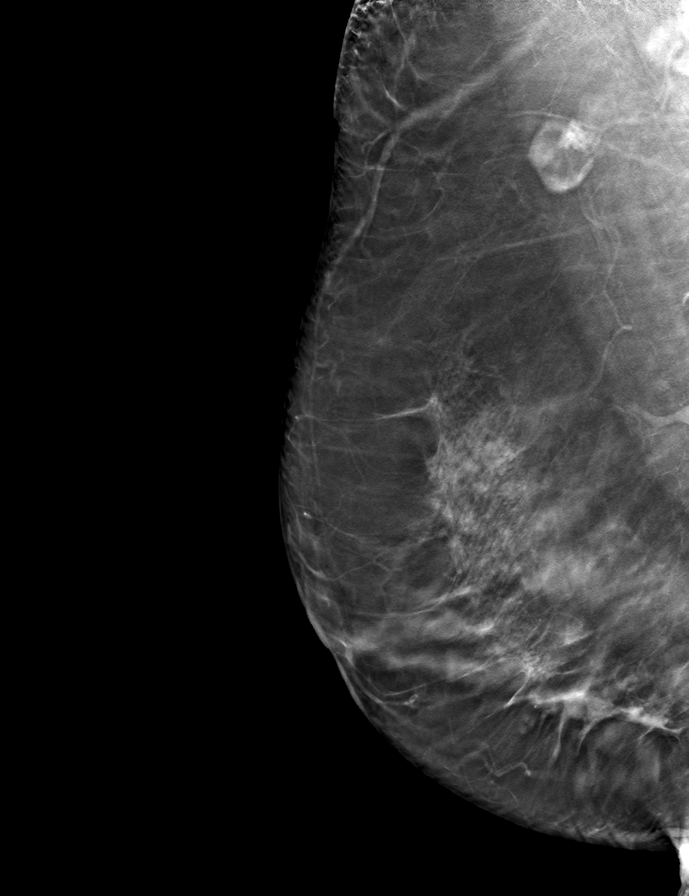
[frame 41/80]
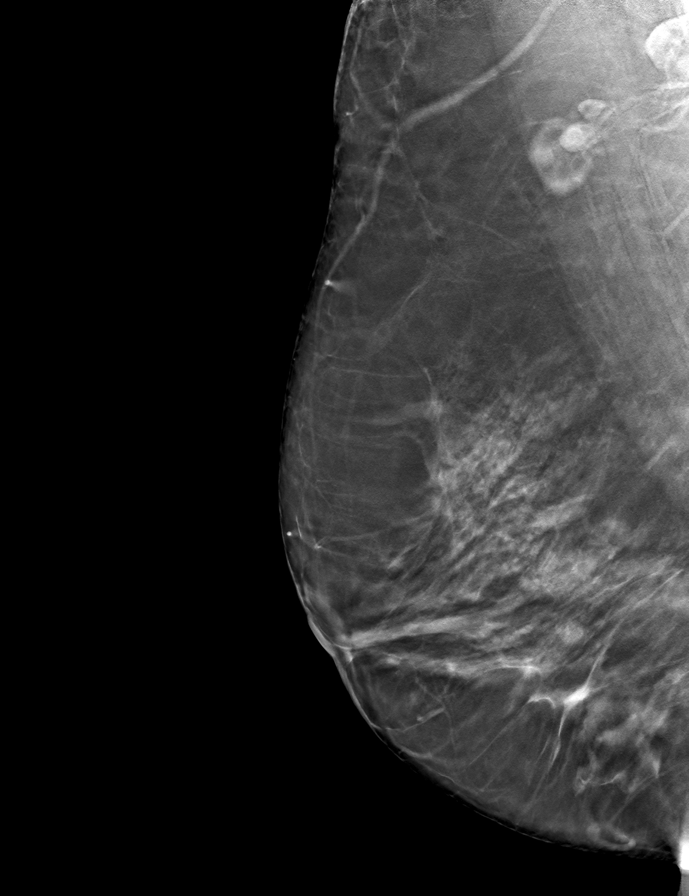

[L MLO tomo · tomo slice 44/87.0]
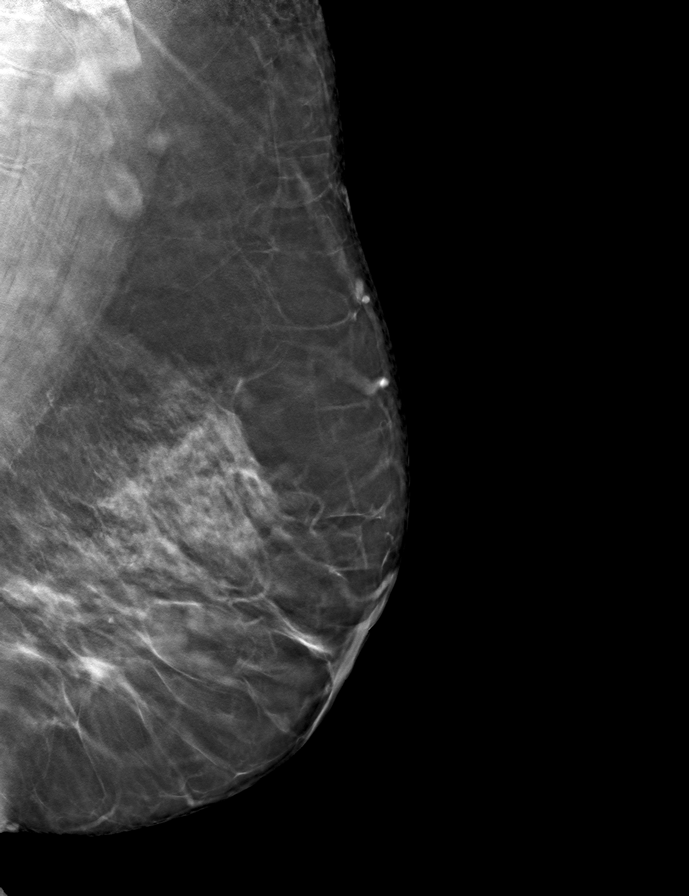

[R CC tomo · tomo slice 45/88.0]
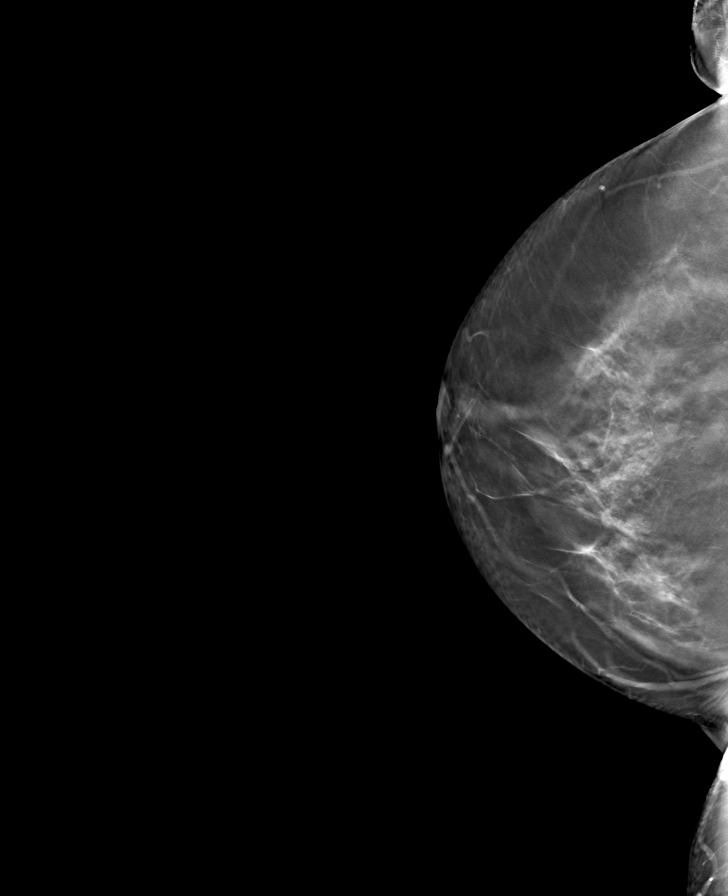

[L CC tomo · tomo slice 45/88.0]
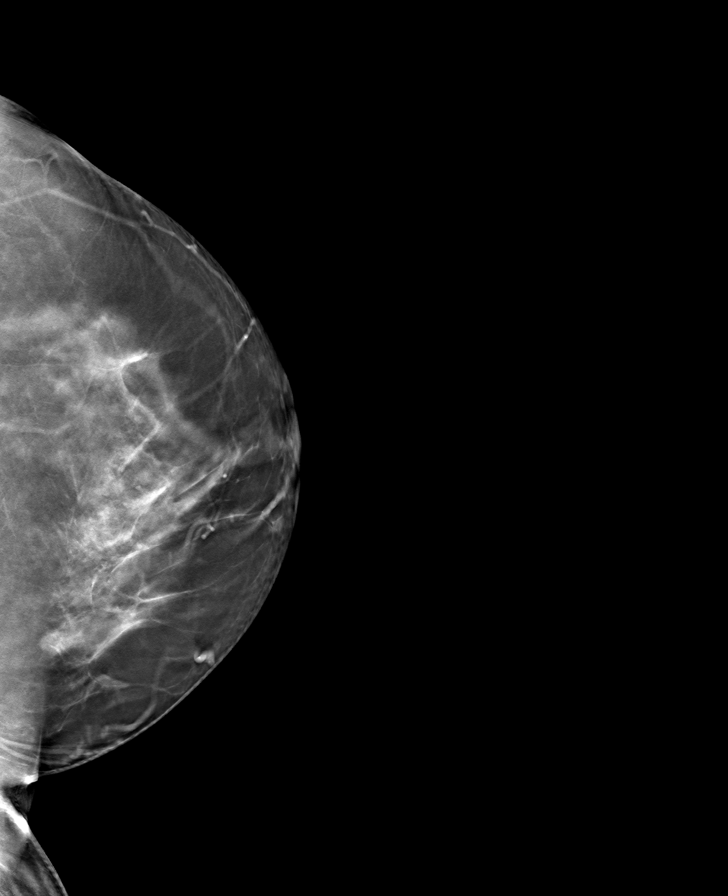

[9 of 24 positions shown; findings below may reference images not displayed]

ACR Breast Density Category c: The breast tissue is heterogeneously
dense, which may obscure small masses.
FINDINGS: In the left breast, a possible mass warrants further evaluation. In
the right breast, no findings suspicious for malignancy.

Images were processed with CAD.
IMPRESSION: Further evaluation is suggested for possible mass in the left
breast.

RECOMMENDATION:
Diagnostic mammogram and possibly ultrasound of the left breast.
(Code:[id number])

The patient will be contacted regarding the findings, and additional
imaging will be scheduled.

BI-RADS CATEGORY  0: Incomplete. Need additional imaging evaluation
and/or prior mammograms for comparison.

## 2020-06-05 ENCOUNTER — Ambulatory Visit
Admission: RE | Admit: 2020-06-05 | Discharge: 2020-06-05 | Disposition: A | Discharge status: Home / Self Care | Payer: Medicare Other | Source: Ambulatory Visit | Attending: Family Medicine | Admitting: Family Medicine

## 2020-06-05 ENCOUNTER — Other Ambulatory Visit: Payer: Self-pay

## 2020-06-05 DIAGNOSIS — N6310 Unspecified lump in the right breast, unspecified quadrant: Secondary | ICD-10-CM

## 2020-06-05 DIAGNOSIS — N6313 Unspecified lump in the right breast, lower outer quadrant: Secondary | ICD-10-CM | POA: Insufficient documentation

## 2020-06-25 ENCOUNTER — Other Ambulatory Visit: Payer: Self-pay | Admitting: Family Medicine

## 2020-06-25 DIAGNOSIS — N6310 Unspecified lump in the right breast, unspecified quadrant: Secondary | ICD-10-CM

## 2020-06-25 DIAGNOSIS — R928 Other abnormal and inconclusive findings on diagnostic imaging of breast: Secondary | ICD-10-CM

## 2020-06-25 DIAGNOSIS — Z1231 Encounter for screening mammogram for malignant neoplasm of breast: Secondary | ICD-10-CM

## 2020-09-29 ENCOUNTER — Other Ambulatory Visit: Payer: Self-pay

## 2020-09-29 ENCOUNTER — Ambulatory Visit
Admission: RE | Admit: 2020-09-29 | Discharge: 2020-09-29 | Disposition: A | Discharge status: Home / Self Care | Payer: Medicare Other | Source: Ambulatory Visit | Attending: Family Medicine | Admitting: Family Medicine

## 2020-09-29 DIAGNOSIS — R928 Other abnormal and inconclusive findings on diagnostic imaging of breast: Secondary | ICD-10-CM

## 2020-09-29 DIAGNOSIS — N6310 Unspecified lump in the right breast, unspecified quadrant: Secondary | ICD-10-CM | POA: Insufficient documentation

## 2020-09-29 DIAGNOSIS — Z1231 Encounter for screening mammogram for malignant neoplasm of breast: Secondary | ICD-10-CM | POA: Diagnosis present

## 2021-04-29 ENCOUNTER — Other Ambulatory Visit: Payer: Self-pay | Admitting: Family Medicine

## 2021-04-29 DIAGNOSIS — M810 Age-related osteoporosis without current pathological fracture: Secondary | ICD-10-CM

## 2021-04-29 DIAGNOSIS — Z780 Asymptomatic menopausal state: Secondary | ICD-10-CM

## 2021-07-20 ENCOUNTER — Ambulatory Visit
Admission: RE | Admit: 2021-07-20 | Discharge: 2021-07-20 | Disposition: A | Discharge status: Home / Self Care | Payer: Medicare Other | Source: Ambulatory Visit | Attending: Family Medicine | Admitting: Family Medicine

## 2021-07-20 DIAGNOSIS — M810 Age-related osteoporosis without current pathological fracture: Secondary | ICD-10-CM | POA: Diagnosis present

## 2021-07-20 DIAGNOSIS — Z780 Asymptomatic menopausal state: Secondary | ICD-10-CM | POA: Diagnosis present

## 2021-07-22 ENCOUNTER — Other Ambulatory Visit: Payer: Self-pay | Admitting: Family Medicine

## 2021-07-22 DIAGNOSIS — Z1231 Encounter for screening mammogram for malignant neoplasm of breast: Secondary | ICD-10-CM

## 2021-07-22 DIAGNOSIS — N6001 Solitary cyst of right breast: Secondary | ICD-10-CM

## 2021-09-30 ENCOUNTER — Other Ambulatory Visit: Payer: Self-pay | Admitting: Family Medicine

## 2021-09-30 ENCOUNTER — Ambulatory Visit
Admission: RE | Admit: 2021-09-30 | Discharge: 2021-09-30 | Disposition: A | Discharge status: Home / Self Care | Payer: Medicare Other | Source: Ambulatory Visit | Attending: Family Medicine | Admitting: Family Medicine

## 2021-09-30 DIAGNOSIS — N6001 Solitary cyst of right breast: Secondary | ICD-10-CM

## 2021-09-30 DIAGNOSIS — R928 Other abnormal and inconclusive findings on diagnostic imaging of breast: Secondary | ICD-10-CM

## 2021-09-30 DIAGNOSIS — Z1231 Encounter for screening mammogram for malignant neoplasm of breast: Secondary | ICD-10-CM | POA: Insufficient documentation

## 2022-07-06 ENCOUNTER — Other Ambulatory Visit: Payer: Self-pay | Admitting: Family Medicine

## 2022-07-06 DIAGNOSIS — E2839 Other primary ovarian failure: Secondary | ICD-10-CM

## 2022-08-18 ENCOUNTER — Other Ambulatory Visit: Payer: Self-pay | Admitting: Family Medicine

## 2022-08-18 DIAGNOSIS — Z1231 Encounter for screening mammogram for malignant neoplasm of breast: Secondary | ICD-10-CM

## 2022-10-07 ENCOUNTER — Ambulatory Visit
Admission: RE | Admit: 2022-10-07 | Discharge: 2022-10-07 | Disposition: A | Discharge status: Home / Self Care | Payer: Medicare Other | Source: Ambulatory Visit | Attending: Family Medicine | Admitting: Family Medicine

## 2022-10-07 DIAGNOSIS — E2839 Other primary ovarian failure: Secondary | ICD-10-CM | POA: Diagnosis present

## 2022-10-07 DIAGNOSIS — Z1231 Encounter for screening mammogram for malignant neoplasm of breast: Secondary | ICD-10-CM | POA: Insufficient documentation

## 2023-06-15 ENCOUNTER — Other Ambulatory Visit: Payer: Self-pay | Admitting: Nephrology

## 2023-06-15 DIAGNOSIS — R8290 Unspecified abnormal findings in urine: Secondary | ICD-10-CM

## 2023-06-15 DIAGNOSIS — N1832 Chronic kidney disease, stage 3b: Secondary | ICD-10-CM

## 2023-06-22 ENCOUNTER — Ambulatory Visit
Admission: RE | Admit: 2023-06-22 | Discharge: 2023-06-22 | Disposition: A | Discharge status: Home / Self Care | Source: Ambulatory Visit | Attending: Nephrology | Admitting: Nephrology

## 2023-06-22 DIAGNOSIS — R8290 Unspecified abnormal findings in urine: Secondary | ICD-10-CM | POA: Diagnosis present

## 2023-06-22 DIAGNOSIS — N1832 Chronic kidney disease, stage 3b: Secondary | ICD-10-CM | POA: Diagnosis present

## 2023-10-06 ENCOUNTER — Other Ambulatory Visit: Payer: Self-pay | Admitting: Family Medicine

## 2023-10-06 DIAGNOSIS — Z1231 Encounter for screening mammogram for malignant neoplasm of breast: Secondary | ICD-10-CM

## 2023-10-24 ENCOUNTER — Ambulatory Visit
Admission: RE | Admit: 2023-10-24 | Discharge: 2023-10-24 | Disposition: A | Discharge status: Home / Self Care | Source: Ambulatory Visit | Attending: Family Medicine | Admitting: Family Medicine

## 2023-10-24 DIAGNOSIS — Z1231 Encounter for screening mammogram for malignant neoplasm of breast: Secondary | ICD-10-CM | POA: Diagnosis present

## 2023-10-31 ENCOUNTER — Other Ambulatory Visit: Payer: Self-pay | Admitting: Family Medicine

## 2023-10-31 DIAGNOSIS — R928 Other abnormal and inconclusive findings on diagnostic imaging of breast: Secondary | ICD-10-CM

## 2023-11-03 ENCOUNTER — Ambulatory Visit
Admission: RE | Admit: 2023-11-03 | Discharge: 2023-11-03 | Disposition: A | Discharge status: Home / Self Care | Source: Ambulatory Visit | Attending: Family Medicine | Admitting: Family Medicine

## 2023-11-03 DIAGNOSIS — R928 Other abnormal and inconclusive findings on diagnostic imaging of breast: Secondary | ICD-10-CM

## 2023-12-13 NOTE — Progress Notes (Signed)
 Chief Complaint:   Chief Complaint  Patient presents with   Hypertension    Subjective:   HPI Margaret Massey is a 73 y.o. female established patient in today for routine follow-up of the following chronic conditions:  Management changes made at last visit include the following: URI She has a sudden onset of congestion without fever or malaise, suggesting a differential diagnosis of common cold, allergies, or mild COVID-19. Test for COVID-19. If positive, advise mask-wearing for five days and avoid close contact.   Stage 4Chronic Kidney Disease   There is a recent increase in creatinine. An MRI is scheduled on April 16th to assess for obstruction. A consultation with Dr. Dennise, a nephrologist, is planned. Advise increased water intake.   Prediabetes   Glucose is at 106 mg/dL and J8r at 6.0, well-managed with weight loss and lifestyle modifications. Continue monitoring glucose and A1c. Encourage continued weight loss and reduced carbohydrate intake.   Essential Hypertension   Blood pressure is well-controlled on the current regimen. Continue current antihypertensive medication regimen.   Chronic Cough   She has a chronic cough with no new symptoms.   Major Depressive disorder, recurrent, in partial remission Her condition is stable on escitalopram  with no new concerns. Continue escitalopram  20 mg daily.   General Health Maintenance   She needs tetanus and RSV vaccines. COVID-19 vaccine status was discussed. Obtain tetanus and RSV vaccines at [facility] or pharmacy. Consider COVID-19 vaccination.  Medical events since last visit: -nephrology consultation 06/15/23.  Discontinue HCTZ due to low BP.  Discontinue Ibandronate due to renal insufficiency. -renal us  06/22/23. -MRI abdomen MRI of abdomen is reassuring.  There are stable cysts in the left kidney.  No concerning findings in the abdomen. Great news!  -[facility] nephrology consultation 06/29/23. -mammogram revealed left breast benign  cyst.  Right breast asymmetry 2017 correlates with benign cysts.  Update since last visit: History of Present Illness Margaret Massey is a 73 year old female with chronic kidney disease who presents for follow-up of her kidney function and blood pressure management.  She has chronic kidney disease with a previous GFR of 27, which has slightly improved to 33. Her creatinine levels have been elevated, reaching 1.6. She has been under the care of nephrologist in [place]. She was taken off hydrochlorothiazide due to low blood pressure, which was 105/72 at her last nephrology visit. She is currently on olmesartan.  She has a history of high blood pressure, which she believes has contributed to her kidney issues. She is concerned about her blood pressure affecting her kidney function, especially since she only has one kidney.  She has prediabetes with a recent A1c of 5.7, indicating improvement. She is mindful of her diet, avoiding bread and potatoes.  She experiences a chronic cough and hoarseness, which she attributes to reflux. She is taking omeprazole  and has noticed that spicy or fried foods exacerbate her symptoms. She does not experience typical heartburn but feels discomfort in her chest after eating certain foods.  Her family history includes a sister who had a stroke last Christmas, affecting her cognitive function. Her sister also has a pacemaker, which complicated her stroke workup as she could not have an MRI. She is concerned about her sister's health and frequently visits her.   Lab Results  Component Value Date   HGBA1C 5.7 (H) 12/06/2023   HGBA1C 6.0 (H) 05/17/2023   Lab Results  Component Value Date   CREATININE 1.6 (H) 12/06/2023   Last Lipids: Lab Results  Component Value Date   CHOLTOTAL 162 05/17/2023   LDLCALC 79 05/17/2023   HDL 68 05/17/2023   TRIG 73 05/17/2023   No results found for: TSH Lab Results  Component Value Date   WBC 6.6 12/06/2023   HGB 12.7  12/06/2023   HCT 39.5 12/06/2023   MCV 89 12/06/2023   PLT 253 12/06/2023    BP Readings from Last 3 Encounters:  12/13/23 133/74  06/29/23 136/68  05/24/23 123/73   Wt Readings from Last 10 Encounters:  12/13/23 72.2 kg (159 lb 2.8 oz)  06/29/23 71.4 kg (157 lb 6.4 oz)  05/24/23 69.6 kg (153 lb 7 oz)  11/08/22 71.8 kg (158 lb 4.6 oz)  06/30/22 72.8 kg (160 lb 7.9 oz)  05/10/22 73.1 kg (161 lb 2.5 oz)  03/27/22 75.3 kg (166 lb 0.1 oz)  03/25/22 73.5 kg (162 lb)  12/15/21 72.1 kg (158 lb 15.2 oz)  12/07/21 73.1 kg (161 lb 2.5 oz)    Allergies  Allergen Reactions   Morphine Nausea   Penicillins Unknown   Buprenorphine Hcl Nausea And Vomiting     Objective:   Vitals:   12/13/23 1044 12/13/23 1049  BP:  133/74  Pulse:  63  Resp:  18  Temp:  36.3 C (97.4 F)  TempSrc:  Oral  SpO2:  98%  Weight: 72.2 kg (159 lb 2.8 oz)   PainSc:  0-No pain   Body mass index is 29.1 kg/m. Physical Exam Constitutional:      General: She is not in acute distress.    Appearance: Normal appearance. She is well-developed. She is not diaphoretic.  HENT:     Right Ear: Tympanic membrane, ear canal and external ear normal.     Left Ear: Tympanic membrane, ear canal and external ear normal.     Mouth/Throat:     Mouth: Mucous membranes are moist.  Eyes:     Extraocular Movements: Extraocular movements intact.     Conjunctiva/sclera: Conjunctivae normal.     Pupils: Pupils are equal, round, and reactive to light.  Neck:     Thyroid : No thyromegaly.  Cardiovascular:     Rate and Rhythm: Normal rate and regular rhythm.     Heart sounds: Normal heart sounds. No murmur heard.    No friction rub. No gallop.  Pulmonary:     Effort: Pulmonary effort is normal. No respiratory distress.     Breath sounds: Normal breath sounds. No decreased breath sounds, wheezing, rhonchi or rales.  Abdominal:     General: Bowel sounds are normal. There is no distension.     Palpations: Abdomen is  soft. There is no mass.     Tenderness: There is no abdominal tenderness. There is no guarding or rebound.     Hernia: No hernia is present.  Musculoskeletal:     Cervical back: Normal range of motion and neck supple.     Right lower leg: No edema.     Left lower leg: No edema.  Lymphadenopathy:     Cervical: No cervical adenopathy.  Skin:    General: Skin is warm and dry.     Capillary Refill: Capillary refill takes less than 2 seconds.     Findings: No rash.  Neurological:     General: No focal deficit present.     Mental Status: She is alert and oriented to person, place, and time. Mental status is at baseline.     Motor: Motor function is intact. No abnormal muscle tone.  Coordination: Coordination normal.  Psychiatric:        Attention and Perception: Attention and perception normal.        Mood and Affect: Mood and affect normal.        Speech: Speech normal.        Behavior: Behavior normal.        Thought Content: Thought content normal.        Cognition and Memory: Cognition normal.        Judgment: Judgment normal.       PHQ 2/9 last 3 flowsheet values     04/27/2021 05/10/2022 05/24/2023  PHQ-9 Depression Screening   Little interest or pleasure in doing things   0  Feeling down, depressed, or hopeless   0  Trouble falling or staying asleep, or sleeping too much   0  Feeling tired or having little energy   0  Poor appetite or overeating   0  Feeling bad about yourself - or that you are a failure or have let yourself or your family down   0  Trouble concentrating on things, such as reading the newspaper or watching television   0  Moving or speaking so slowly that other people could have noticed? Or the opposite - being so fidgety or restless that you have been moving around a lot more than usual.   0  Thoughts that you would be better off dead or hurting yourself in some way   0  Patient Health Questionnaire-9 Score   0 *  (OBSOLETE) Little interest or pleasure  in doing things 1 0   (OBSOLETE) Feeling down, depressed, or hopeless (or irritable for Teens only)? 1 1   (OBSOLETE) Trouble falling or staying asleep, or sleeping too much? 0 0   (OBSOLETE) Feeling tired or having little energy? 0 0   (OBSOLETE) Poor appetite or overeating? 0 0   (OBSOLETE) Feeling bad about yourself - or that you are a failure or have let yourself or your family down? 0 0   (OBSOLETE) Trouble concentrating on things, such as reading the newspaper or watching television? 0    (OBSOLETE) Moving or speaking so slowly that other people could have noticed?  Or the opposite - being so fidgety or restless that you have been moving around a lot more than usual? 0 0   (OBSOLETE) Thoughts that you would be better off dead, or of hurting yourself in some way? 0 0   (OBSOLETE) How difficult have these problems made it for you do your work, take care of things at home, or get along with people? Not difficult at all Not difficult at all   (OBSOLETE) Total Score = 0   2 1     * Patient-reported   Multiple values from one day are sorted in reverse-chronological order      Depression Severity and Treatment Recommendations:  0-4= None  5-9= Mild / Treatment: Support, educate to call if worse; return in one month  10-14= Moderate / Treatment: Support, watchful waiting; Antidepressant or Psychotherapy  15-19= Moderately severe / Treatment: Antidepressant OR Psychotherapy  >= 20 = Major depression, severe / Antidepressant AND Psychotherapy GAD 7 result:     04/27/2021 05/10/2022 05/24/2023  GAD-7 w/ Score  (OBSOLETE) Over the past 2 weeks, have you felt nervous, anxious, or on edge? Not at all * Not at all * Not at all *  (OBSOLETE) Over the past 2 weeks, have you not been able to stop or  control worrying? Not at all * Not at all * Not at all *  (OBSOLETE) Worrying too much about different things Not at all * Not at all * Not at all *  (OBSOLETE) Trouble relaxing Not at all * Not at all *  Not at all *  (OBSOLETE) Being so restless that it is hard to sit still Not at all * Not at all * Not at all *  (OBSOLETE) Becoming easily annoyed or irritable Not at all * Several days * Not at all *  (OBSOLETE) Feeling afraid as if something awful might happen Not at all * Not at all * Not at all *  (OBSOLETE) GAD-7 Total Score 0 *   0 * 1 * 0 * *  --     (OBSOLETE) How difficult have these problems made it for you to do your work, take care of things at home, or get along with other people?   Not difficult at all    * Patient-reported  * Data saved with a previous flowsheet row definition   Multiple values from one day are sorted in reverse-chronological order     Anxiety Severity:     0-4 = Minimal Anxiety     5-9 = Mild Anxiety 10-14 = Moderate Anxiety 15-21 = Severe Anxiety Assessment/Plan:   Diagnoses and all orders for this visit:  Essential hypertension  Stage 4 chronic kidney disease (CMS/HHS-HCC)  Prediabetes  Pure hypercholesterolemia  Gastroesophageal reflux disease without esophagitis -     omeprazole  (PRILOSEC) 40 MG DR capsule; Take 1 capsule (40 mg total) by mouth 2 (two) times daily before meals  Need for vaccination -     [id number] - Flu Vaccine HD-IIV3 High Dose,IM PF(75YO+)(FLUZONE )  Other orders -     Follow up in Primary Care -     Follow up in Primary Care; Future -     Lab Appointment Request; Future   Assessment & Plan 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 Creatinine levels have been fluctuating, currently at 1.6. Blood pressure management is crucial to prevent further kidney damage. Nephrologist involved in care, recent adjustment in medication due to low blood pressure. She is far from requiring dialysis. - Continue monitoring kidney function with nephrologist - Maintain current blood pressure medication regimen - Follow up with  nephrologist in November  Essential hypertension Blood pressure management is critical due to its role in kidney function deterioration. Recent adjustment in medication due to low blood pressure readings. Current blood pressure is well-managed. - Continue current blood pressure medication regimen - Monitor blood pressure regularly  Prediabetes Prediabetes with recent improvement in HbA1c to 5.7. Emphasis on dietary management to prevent progression to diabetes, which could further impact kidney function. - Continue dietary management to prevent progression to diabetes - Monitor HbA1c regularly  Gastroesophageal reflux disease with chronic cough and hoarseness Chronic cough and hoarseness likely related to gastroesophageal reflux disease. Symptoms exacerbated by certain foods, particularly spicy and fried foods. Current medication is omeprazole , considering increasing dosage due to persistent symptoms. Reflux can lead to changes in the esophagus that may cause esophageal cancer if not controlled. - Increase omeprazole  to twice daily - Consider referral to gastroenterologist for endoscopy if symptoms persist - Avoid trigger foods such as spicy and fried foods -s/p previous ENT consultation for symptoms.  General Health Maintenance Discussion on vaccinations and general health maintenance.  Flu shot recommended and administered. RSV vaccine recommended due to age. COVID vaccine discussed but not required. - Administer flu shot - Recommend RSV vaccine at pharmacy - Discuss COVID vaccine options  Follow-Up Follow-up plans discussed for nephrology and general health maintenance. - Follow up with nephrologist in November - Schedule lab visit before next appointment - Return for follow-up in six months  This note has been created using automated tools and reviewed for accuracy by [facility].  Future Appointments     Date/Time Provider Department Center Visit Type   06/04/2024 9:15  AM (Arrive by 9:00 AM) [facility]   07/02/2024 10:00 AM (Arrive by 9:45 AM) Claudene Rayfield Lunger, MD [facility]      Follow up in 6 months (on 06/03/2024).  Follow up in 28 weeks (on 06/26/2024). Follow-up     Normal Orders This Visit   Follow up in Primary Care [[id number] Custom]    Questions:   Does this order need to be coordinated with another visit or should it be hidden from the patient portal? If yes to either, the patient will need to stop by the front desk or call to schedule.: No   Who is this follow-up with?: Me   Can this appointment be overbooked by a scheduler?: No   What type of follow up is needed?: Office Visit   What's the reason for follow up?: Blood Pressure   Allow telemedicine?: In Person Only    Future Labs/Procedures Expected by Expires   Lab Appointment Request [[id number] Custom]  06/03/2024 12/12/2024   Follow up in Primary Care [[id number] Custom]  06/26/2024 12/12/2024   Questions:     Does this order need to be coordinated with another visit or should it be hidden from the patient portal? If yes to either, the patient will need to stop by the front desk or call to schedule.: No   Who is this follow-up with?: PCP   What type of follow up is needed?: Annual Wellness   Allow telemedicine?: In Person Only   What's the reason for follow up?:            Rayfield Lunger Claudene, M.D. [facility] [address] [place], [place] [address] Office: [phone] *Some images could not be shown.

## 2024-01-24 NOTE — Progress Notes (Signed)
 [facility] Follow Up Visit   Patient Name: Margaret Massey, female   Patient DOB: 06-06-1950 Date of Service: 01/24/2024  Patient MRN: [id number] Provider Creating Note: Woodward Brought, MD  [phone] Primary Care Physician: Claudene Quaker, MD   [address] [place] [address] Additional Physicians/ Providers:   Impression/Recommendations   Ms. Margaret Massey is a 73 y.o. female with hypertension, GERD, hyperlipidemia, depression, anxiety, osteoporosis and renal cell carcinoma status post nephrectomy who presents as a new patient for evaluation of chronic kidney disease stage IIIB. Creatinine 1.42, GFR of 39. PCR 63mg .   Chronic Kidney Disease stage IIIB: with history of hematuria: red cells nondysmorphic. No hematuria on recent urinalysis. Most likely secondary to solitary kidney with hyperfiltration.  - continue olmesartan - no indication for an SGLT-2 inhibitor.  - not currently on a mineralocorticoid receptor antagonist. Discussed role of these agents in blood pressure control, lower extremity swelling, fluid management, cardiac and renal protection. Recommend if proteinuria is elevated.  - Avoid nonsteroidal anti-inflammatory agents.    Hypertension: with chronic kidney disease: 161/82.  However home readings are at goal.  - continue olmesartan.  - Recommend home blood pressure monitoring - low salt diet.    Hyperlipidemia:  - continue atorvastatin   Patient Active Problem List  Diagnosis   Hematuria   Hyperlipidemia   Chronic kidney disease stage 3B (HCC)   Hypertensive chronic kidney disease, benign, with chronic kidney disease stage I through stage IV, or unspecified    Orders Placed This Encounter   PTH, Intact   Renal Function Panel   CBC and Differential   Urinalysis, Complete w/reflex to Culture   Protein, Total, Random Urine w/Creatinine (Protein/Creat Ratio)       Return in about 6 months (around 07/23/2024).  Chief Complaint    Chief Complaint  Patient presents with   Follow-up    History of Present Illness   Ms. Margaret Massey presents for follow up. Patient presents with her husband who assists with history taking.   Patient has no complaints. Patient state that she is in her usual state of health. Patient has had no hospitalizations.   Patient was changed from benazepril to olmesartan since last visit. She is tolerating this well. Patient states her lower extremity swelling is well controlled. Patient states her home blood pressure readings are at goal, systolic in the 130s.   Patient denies use of nonsteroidal anti-inflammatory agents.     Medications   Current Outpatient Medications:    aspirin (ST JOSEPH) 81 MG EC tablet, Take 81 mg by mouth 1 (one) time each day, Disp: , Rfl:    atorvastatin  (LIPITOR) 10 MG tablet, Take 10 mg by mouth 1 (one) time each day, Disp: , Rfl:    Calcium  Carbonate 260 MG chewable tablet, Chew 750 mg 1 (one) time each day, Disp: , Rfl:    cetirizine (ZyrTEC) 10 MG tablet, Take 10 mg by mouth in the morning., Disp: , Rfl:    cholecalciferol (Vitamin D-1000 Max St) 25 MCG (1000 UT) tablet, Take by mouth daily, Disp: , Rfl:    escitalopram  (LEXAPRO ) 20 MG tablet, , Disp: , Rfl:    olmesartan (Benicar) 40 MG tablet, Take 1 tablet (40 mg total) by mouth 1 (one) time each day, Disp: 30 tablet, Rfl: 11   Omega-3 1000 MG capsule, Take by mouth daily, Disp: , Rfl:    omeprazole  (PriLOSEC) 40 MG DR capsule, Take 40 mg by mouth in the morning and 40 mg  in the evening., Disp: , Rfl:    Allergies Penicillins and Morphine and codeine  History Past Medical History:  Diagnosis Date   Anxiety    Chronic kidney disease stage 3B (HCC)    Gastroesophageal reflux disease    Hematuria    Hyperlipidemia    Hypertensive chronic kidney disease, benign, with chronic kidney disease stage I through stage IV, or unspecified    Major depressive disorder    Osteoporosis     Renal cell carcinoma (HCC)    Seasonal allergy     Past Surgical History:  Procedure Laterality Date   BREAST LUMPECTOMY     KNEE SURGERY     NEPHRECTOMY     Family History  Problem Relation Age of Onset   Stroke Sister    Diabetes Sister    Social History   Tobacco Use   Smoking status: Never   Smokeless tobacco: Never  Substance Use Topics   Alcohol use: Yes    Comment: rarely-wine     Physical Exam  Vitals BP 152/78 (BP Location: Right upper arm, Patient Position: Standing)   Temp 98.4 F   Wt 160 lb 3.2 oz (72.7 kg)   SpO2 99%   BMI 29.30 kg/m   Vitals reviewed. Constitutional: She is oriented to person, place, and time. She appears well-developed.  HEENT:  Head: Normocephalic and atraumatic. Mouth/Throat: Oropharynx is clear and moist.  Eyes: Pupils are equal, round, and reactive to light.  Neck: Neck supple.  Cardiovascular:  Normal rate and regular rhythm.           Pulmonary/Chest: Effort normal and breath sounds normal.  Abdominal: Soft.  Neurological: She is alert and oriented to person, place, and time.  Skin: Skin is warm and dry.     Laboratory Studies  Chemistry  Lab Units 01/17/24 0831 12/06/23 9095 07/18/23 0932 06/15/23 1042 06/15/23 0939 05/17/23 0919 11/08/22 1638 05/10/22 1505  SODIUM mmol/L 140  --  137 139  --   --   --   --   POTASSIUM mmol/L 4.2  --  4.7 4.9  --   --   --   --   CHLORIDE mmol/L 107  --  107 106  --   --   --   --   CO2 mmol/L 24  --  23 24  --   --   --   --   MAGNESIUM mg/dL  --   --   --  2.3  --   --   --   --   CALCIUM  mg/dL 9.1  --  9.5 9.7  --   --   --   --   PHOSPHORUS mg/dL 3.9  --  3.5 3.6  --   --   --   --   ALK PHOS U/L  --   --   --  45  --   --   --   --   PTH pg/mL 38  --   --  29  --   --   --   --   GLUCOSE mg/dL 891*  --  899 890*  --   --   --   --   ALBUMIN g/dL 3.9  --  4.1 4.4  4.4 30 mg/L  --   --   --   BUN mg/dL 26*  --  30* 39*  --   --   --   --   CREATININE mg/dL 8.57*   --  1.33* 1.73*  --   --   --   --   HEMOGLOBIN A1C %  --  5.7*  --   --   --  6.0* 5.9* 6.0*        No lab exists for component: IRON SATURATION, TRANSSATPER  CBC  Lab Units 01/17/24 0831 07/18/23 0932 06/15/23 1042  WBC AUTO [place]/uL 6.4  --  6.7  HEMOGLOBIN g/dL 87.5  --  87.5  HEMOGLOBIN URINE  NEGATIVE NEGATIVE  --   HEMATOCRIT % 38.3  --  37.5  MCV fL 90.3  --  89.1  PLATELETS AUTO [place]/uL 223  --  232    Urine  Lab Units 01/17/24 0831 07/18/23 0932 06/29/23 1554 06/15/23 0939 05/17/23 0919 05/10/22 1505  COLOR U  YELLOW YELLOW  --   --   --   --   COLOR UA   --   --   --  Yellow  --   --   CLARITY UA   --   --   --  Clear  --   --   KETONES U MG/DL  NEGATIVE NEGATIVE  --   --   --   --   KETONES UA   --   --   --  Negative  --   --   PH UA   --   --   --  6.0  --   --   UROBILINOGEN UA   --   --   --  0.2  --   --   PROT/CREAT RATIO UR mg/g creat 0.063  63  --   --   --   --   --   ALB MG/G CREAT UR mg/g  --   --  <3.4  --  <1.7 <3.1        No lab exists for component: CYCLOSPORITR     Woodward Brought, MD  [employer], [place]
